# Patient Record
Sex: Female | Born: 1950 | Race: White | Hispanic: No | Marital: Married | Smoking: Never smoker
Health system: Southern US, Community
[De-identification: ages and names within clinical notes are randomized; demographics above are authoritative.]

## PROBLEM LIST (undated history)

## (undated) DIAGNOSIS — T7840XA Allergy, unspecified, initial encounter: Secondary | ICD-10-CM

## (undated) DIAGNOSIS — E7800 Pure hypercholesterolemia, unspecified: Secondary | ICD-10-CM

## (undated) DIAGNOSIS — I10 Essential (primary) hypertension: Secondary | ICD-10-CM

## (undated) DIAGNOSIS — K219 Gastro-esophageal reflux disease without esophagitis: Secondary | ICD-10-CM

## (undated) DIAGNOSIS — C801 Malignant (primary) neoplasm, unspecified: Secondary | ICD-10-CM

## (undated) DIAGNOSIS — H269 Unspecified cataract: Secondary | ICD-10-CM

## (undated) DIAGNOSIS — J45909 Unspecified asthma, uncomplicated: Secondary | ICD-10-CM

## (undated) DIAGNOSIS — M1990 Unspecified osteoarthritis, unspecified site: Secondary | ICD-10-CM

## (undated) HISTORY — PX: BREAST EXCISIONAL BIOPSY: SUR124

## (undated) HISTORY — PX: BREAST SURGERY: SHX581

## (undated) HISTORY — DX: Allergy, unspecified, initial encounter: T78.40XA

## (undated) HISTORY — DX: Unspecified cataract: H26.9

## (undated) HISTORY — DX: Malignant (primary) neoplasm, unspecified: C80.1

## (undated) HISTORY — PX: EYE SURGERY: SHX253

## (undated) HISTORY — DX: Gastro-esophageal reflux disease without esophagitis: K21.9

## (undated) HISTORY — PX: APPENDECTOMY: SHX54

## (undated) HISTORY — DX: Unspecified osteoarthritis, unspecified site: M19.90

---

## 1999-04-21 ENCOUNTER — Ambulatory Visit (HOSPITAL_COMMUNITY): Admission: RE | Admit: 1999-04-21 | Discharge: 1999-04-21 | Payer: Self-pay | Admitting: Urology

## 1999-04-21 ENCOUNTER — Encounter: Payer: Self-pay | Admitting: Urology

## 1999-05-02 ENCOUNTER — Ambulatory Visit (HOSPITAL_COMMUNITY): Admission: RE | Admit: 1999-05-02 | Discharge: 1999-05-02 | Payer: Self-pay | Admitting: Urology

## 2000-01-01 ENCOUNTER — Other Ambulatory Visit: Admission: RE | Admit: 2000-01-01 | Discharge: 2000-01-01 | Payer: Self-pay | Admitting: *Deleted

## 2000-08-14 ENCOUNTER — Encounter: Admission: RE | Admit: 2000-08-14 | Discharge: 2000-08-14 | Payer: Self-pay | Admitting: *Deleted

## 2000-08-14 ENCOUNTER — Encounter: Payer: Self-pay | Admitting: *Deleted

## 2001-02-03 ENCOUNTER — Other Ambulatory Visit: Admission: RE | Admit: 2001-02-03 | Discharge: 2001-02-03 | Payer: Self-pay | Admitting: Obstetrics and Gynecology

## 2002-02-12 ENCOUNTER — Other Ambulatory Visit: Admission: RE | Admit: 2002-02-12 | Discharge: 2002-02-12 | Payer: Self-pay | Admitting: Obstetrics and Gynecology

## 2003-02-15 ENCOUNTER — Other Ambulatory Visit: Admission: RE | Admit: 2003-02-15 | Discharge: 2003-02-15 | Payer: Self-pay | Admitting: Obstetrics and Gynecology

## 2003-04-26 ENCOUNTER — Encounter: Payer: Self-pay | Admitting: Obstetrics and Gynecology

## 2003-04-26 ENCOUNTER — Encounter: Admission: RE | Admit: 2003-04-26 | Discharge: 2003-04-26 | Payer: Self-pay | Admitting: Obstetrics and Gynecology

## 2004-03-01 ENCOUNTER — Other Ambulatory Visit: Admission: RE | Admit: 2004-03-01 | Discharge: 2004-03-01 | Payer: Self-pay | Admitting: Obstetrics and Gynecology

## 2004-04-26 ENCOUNTER — Encounter: Admission: RE | Admit: 2004-04-26 | Discharge: 2004-04-26 | Payer: Self-pay | Admitting: Obstetrics and Gynecology

## 2005-03-02 ENCOUNTER — Other Ambulatory Visit: Admission: RE | Admit: 2005-03-02 | Discharge: 2005-03-02 | Payer: Self-pay | Admitting: Obstetrics and Gynecology

## 2005-04-30 ENCOUNTER — Encounter: Admission: RE | Admit: 2005-04-30 | Discharge: 2005-04-30 | Payer: Self-pay | Admitting: Obstetrics and Gynecology

## 2005-12-04 ENCOUNTER — Emergency Department (HOSPITAL_COMMUNITY): Admission: EM | Admit: 2005-12-04 | Discharge: 2005-12-04 | Payer: Self-pay | Admitting: Emergency Medicine

## 2006-02-26 ENCOUNTER — Other Ambulatory Visit: Admission: RE | Admit: 2006-02-26 | Discharge: 2006-02-26 | Payer: Self-pay | Admitting: Obstetrics and Gynecology

## 2006-05-02 ENCOUNTER — Encounter: Admission: RE | Admit: 2006-05-02 | Discharge: 2006-05-02 | Payer: Self-pay | Admitting: Obstetrics and Gynecology

## 2006-11-20 ENCOUNTER — Encounter: Admission: RE | Admit: 2006-11-20 | Discharge: 2006-11-20 | Payer: Self-pay | Admitting: *Deleted

## 2007-01-20 ENCOUNTER — Ambulatory Visit (HOSPITAL_COMMUNITY): Admission: RE | Admit: 2007-01-20 | Discharge: 2007-01-20 | Payer: Self-pay | Admitting: *Deleted

## 2007-05-12 ENCOUNTER — Encounter: Admission: RE | Admit: 2007-05-12 | Discharge: 2007-05-12 | Payer: Self-pay | Admitting: Obstetrics and Gynecology

## 2007-05-20 ENCOUNTER — Encounter: Admission: RE | Admit: 2007-05-20 | Discharge: 2007-05-20 | Payer: Self-pay | Admitting: Obstetrics and Gynecology

## 2007-05-20 ENCOUNTER — Encounter (INDEPENDENT_AMBULATORY_CARE_PROVIDER_SITE_OTHER): Payer: Self-pay | Admitting: Diagnostic Radiology

## 2007-07-08 ENCOUNTER — Ambulatory Visit (HOSPITAL_BASED_OUTPATIENT_CLINIC_OR_DEPARTMENT_OTHER): Admission: RE | Admit: 2007-07-08 | Discharge: 2007-07-08 | Payer: Self-pay | Admitting: Surgery

## 2007-07-08 ENCOUNTER — Encounter (INDEPENDENT_AMBULATORY_CARE_PROVIDER_SITE_OTHER): Payer: Self-pay | Admitting: Surgery

## 2007-07-08 ENCOUNTER — Encounter: Admission: RE | Admit: 2007-07-08 | Discharge: 2007-07-08 | Payer: Self-pay | Admitting: Surgery

## 2008-06-15 ENCOUNTER — Encounter: Admission: RE | Admit: 2008-06-15 | Discharge: 2008-06-15 | Payer: Self-pay | Admitting: Obstetrics and Gynecology

## 2008-08-27 IMAGING — MG MM BREAST WIRE LOCALIZATION*L*
5 series · 5 of 5 positions shown · non-contrast
Comparison: 54-year-old underwent left breast ultrasound-guided core biopsy of a 7 mm mass 5 
o'clock location.

MAMMO BREAST SURGICAL SPECIMEN

BREAST WIRE LOCALIZATION *L*
BREAST WIRE LOCALIZATION, RIGHT AND SURGICAL SPECIMEN:

[L FB (1 of 2)]
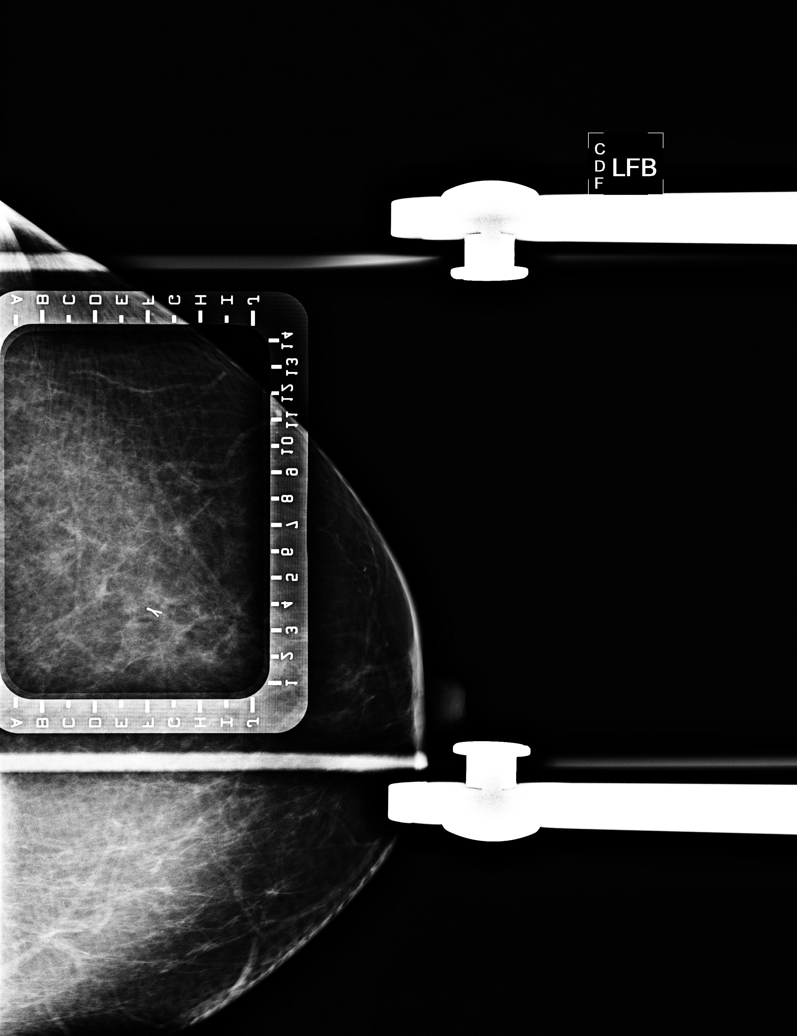

[L FB (2 of 2)]
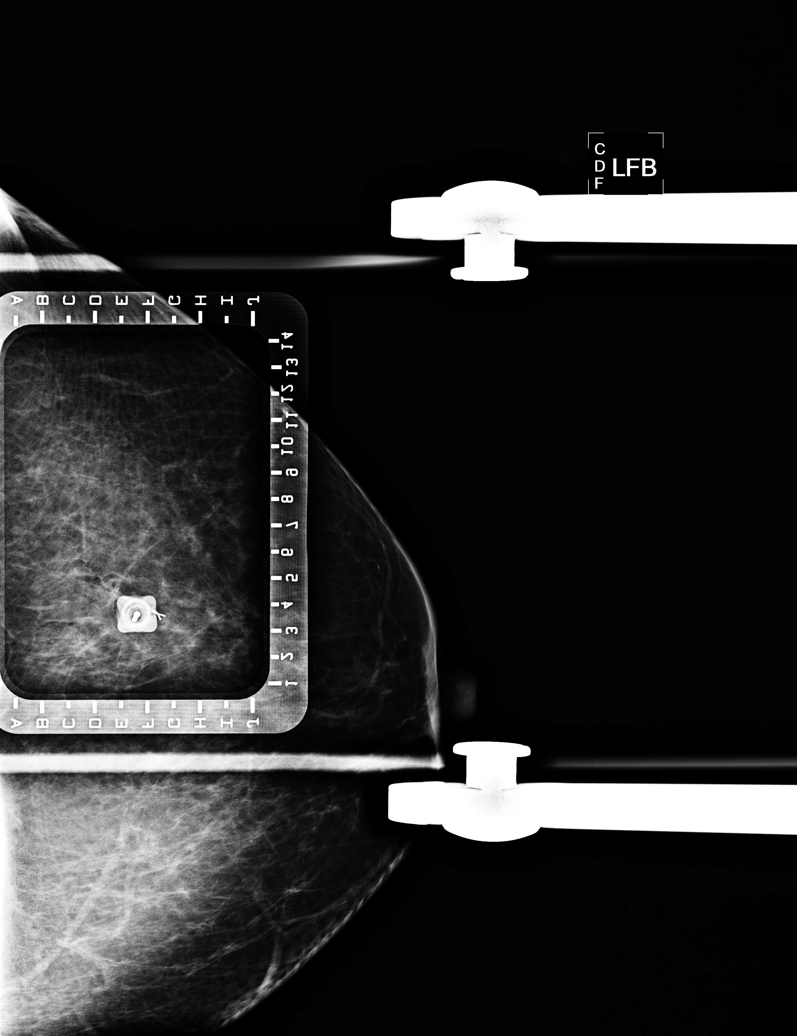

[L LM (1 of 3)]
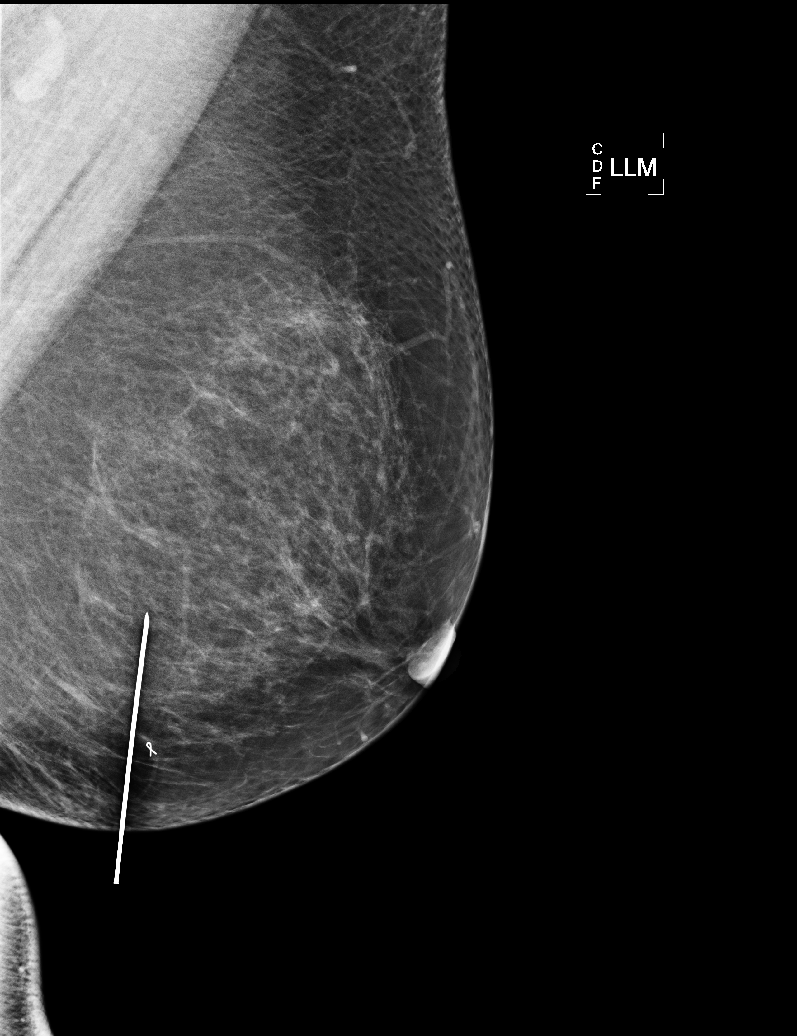

[L LM (2 of 3)]
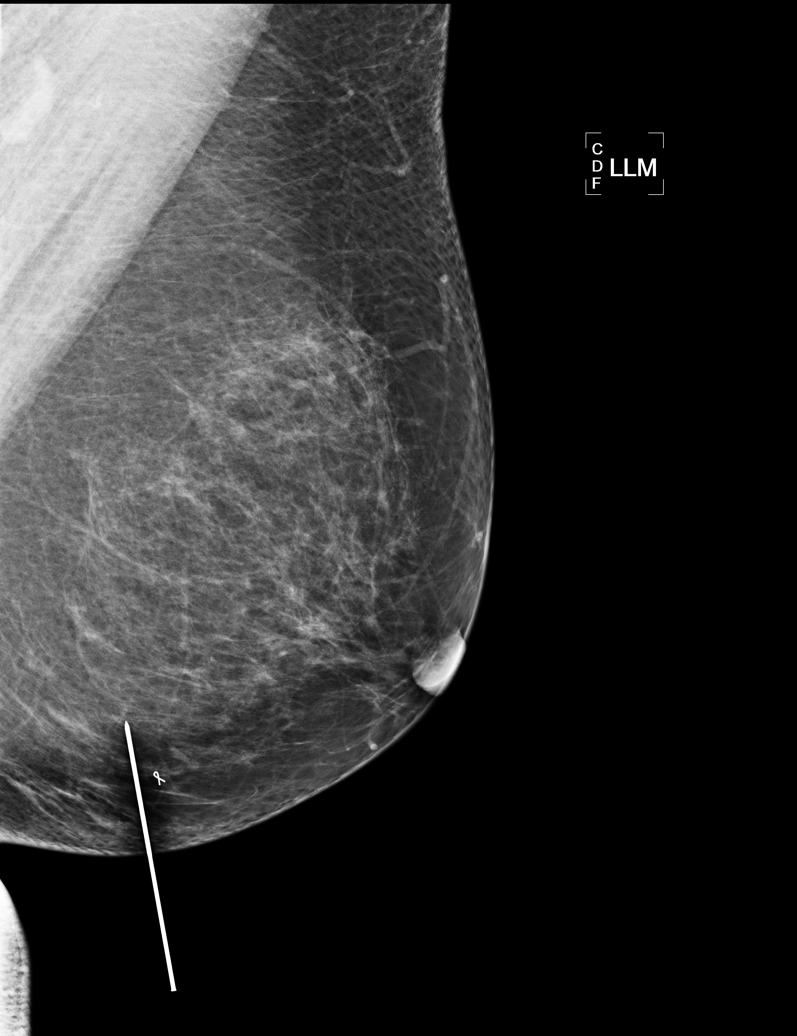

[L LM (3 of 3)]
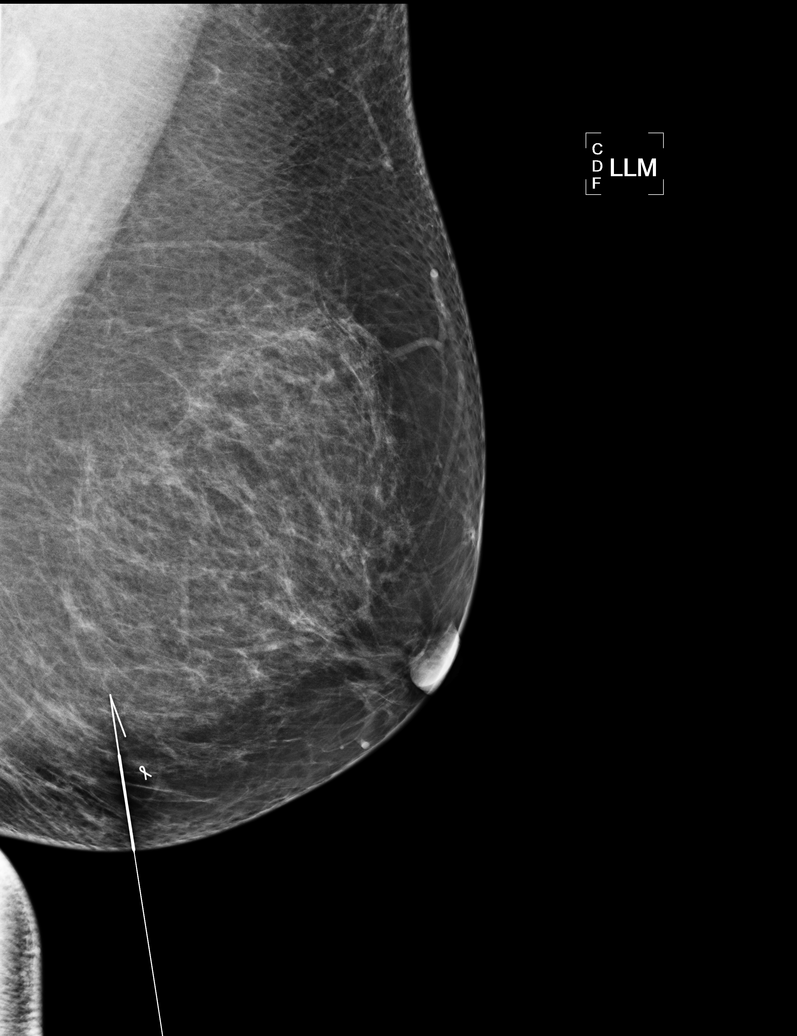

[5 of 5 positions shown; findings below may reference images not displayed]

Pathologic diagnosis reveals tubular adenoma.  Sclerosing papilloma was also in 
the differential consideration and the pathologist suggested surgical excision for complete 
evaluation.  The patient presents today for wire localization prior to surgical excision.

A  biopsy clip was placed during the time of previous ultrasound-guided biopsy and confirmed to be 
in adequate position on post biopsy mammogram.

With the left breast in the craniocaudal position in the mammogram unit, the biopsy clip was 
localized and following local anesthesia with Lidocaine, a 5 cm localization needle was advanced 
into the breast from an inferior approach.  An orthogonal view was obtained to confirm the depth of
the needle and subsequently a wire was placed through the needle.  Final mammographic image in the
lateral to medial projection confirms wire placement immediately adjacent and slightly posterior 
to the titanium biopsy clip and small residual nodule.

The patient tolerated the procedure well.

Surgical specimen was radiographed and shows the needle localization wire and the biopsy clip to be
within the surgical specimen.
IMPRESSION: Successful wire localization, surgical excisional biopsy of the left breast with pathology pending.

,

## 2009-06-16 ENCOUNTER — Encounter: Admission: RE | Admit: 2009-06-16 | Discharge: 2009-06-16 | Payer: Self-pay | Admitting: Family Medicine

## 2011-02-01 ENCOUNTER — Other Ambulatory Visit: Payer: Self-pay | Admitting: Dermatology

## 2011-03-27 NOTE — Op Note (Signed)
NAMESUZETTE, FLAGLER            ACCOUNT NO.:  [id number]   MEDICAL RECORD NO.:  [id number]          PATIENT TYPE:  AMB   LOCATION:  [facility]                          FACILITY:  [facility]   PHYSICIAN:  Wilmon Arms. Corliss Skains, M.D. DATE OF BIRTH:  Nov 12, 1951   DATE OF PROCEDURE:  07/08/2007  DATE OF DISCHARGE:                               OPERATIVE REPORT   PREOP DIAGNOSIS:  Left breast tubular adenoma.   POSTOPERATIVE DIAGNOSIS:  Left breast tubular adenoma.   PROCEDURE PERFORMED:  Left breast needle-localized lumpectomy.   SURGEON:  Wilmon Arms. Corliss Skains, M.D., FACS   ANESTHESIA:  General LMA.   INDICATIONS:  The patient is a 60 year old female who recently underwent  a screening mammogram showing a suspicious area in the left breast.  Ultrasound confirms a shadowing mass in this area.  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  The patient is now referred for complete  excision.   DESCRIPTION OF PROCEDURE:  The patient was brought to the operating room  and placed in supine position on operating table.  After an adequate  level of general anesthesia was obtained, her left breast was prepped  with Betadine and draped in a sterile fashion.  A wire had previously  been placed in the inferior left breast just above the inframammary  crease.  This area was infiltrated with 1/4% Marcaine.   A transverse incision was made around the wire.  Cautery was then used  to raise skin flaps and we excised a core of breast tissue around the  wire.  The clip was only about 3 cm deep.  The tip of the wire was noted  to be 3.5 cm deep.  We saw all the tip of the wire in the superior  margin of the specimen.  We went past the tip of the wire by another  couple of centimeters and removed the specimen.   The Faxitron x-ray machine was used to identify the clip within the  midportion of the specimen.  The tip of the wire was intact.  These  images  were transmitted to the radiologist who confirmed these findings.  The wound was irrigated.  It was closed with a deep layer of 3-0 Vicryl  and subcuticular layer of 4-0 Monocryl.  Steri-Strips and clean  dressings were applied.  The patient was extubated and brought to  recovery room in stable condition.  All sponge, instrument, and needle  counts correct.      Wilmon Arms. Tsuei, M.D.  Electronically Signed     MKT/MEDQ  D:  07/08/2007  T:  07/08/2007  Job:  [id number]

## 2011-07-18 ENCOUNTER — Other Ambulatory Visit: Payer: Self-pay | Admitting: Internal Medicine

## 2011-07-18 DIAGNOSIS — Z1231 Encounter for screening mammogram for malignant neoplasm of breast: Secondary | ICD-10-CM

## 2011-07-18 DIAGNOSIS — Z780 Asymptomatic menopausal state: Secondary | ICD-10-CM

## 2011-08-24 LAB — DIFFERENTIAL
Basophils Absolute: 0
Basophils Relative: 0
Eosinophils Absolute: 0.1
Eosinophils Relative: 2
Lymphocytes Relative: 31
Lymphs Abs: 2.1
Monocytes Absolute: 0.7
Monocytes Relative: 11
Neutro Abs: 3.8
Neutrophils Relative %: 56

## 2011-08-24 LAB — BASIC METABOLIC PANEL
BUN: 10
CO2: 28
Calcium: 9.5
Chloride: 105
Creatinine, Ser: 0.88
GFR calc Af Amer: >60
GFR calc non Af Amer: >60
Glucose, Bld: 102 — ABNORMAL HIGH
Potassium: 4.1
Sodium: 141

## 2011-08-24 LAB — CBC
HCT: 38.4
Hemoglobin: 13.2
MCHC: 34.5
MCV: 88.7
Platelets: 292
RBC: 4.33
RDW: 13.3
WBC: 6.7

## 2011-10-19 ENCOUNTER — Other Ambulatory Visit: Payer: Self-pay | Admitting: Dermatology

## 2012-03-05 ENCOUNTER — Other Ambulatory Visit: Payer: Self-pay | Admitting: Dermatology

## 2012-04-09 ENCOUNTER — Other Ambulatory Visit: Payer: Self-pay | Admitting: Internal Medicine

## 2012-04-09 DIAGNOSIS — Z780 Asymptomatic menopausal state: Secondary | ICD-10-CM

## 2012-04-09 DIAGNOSIS — Z1231 Encounter for screening mammogram for malignant neoplasm of breast: Secondary | ICD-10-CM

## 2012-04-22 ENCOUNTER — Ambulatory Visit
Admission: RE | Admit: 2012-04-22 | Discharge: 2012-04-22 | Disposition: A | Discharge status: Home / Self Care | Payer: 59 | Source: Ambulatory Visit | Attending: Internal Medicine | Admitting: Internal Medicine

## 2012-04-22 DIAGNOSIS — Z1231 Encounter for screening mammogram for malignant neoplasm of breast: Secondary | ICD-10-CM

## 2012-10-23 ENCOUNTER — Other Ambulatory Visit: Payer: Self-pay | Admitting: Dermatology

## 2013-04-23 ENCOUNTER — Other Ambulatory Visit: Payer: Self-pay | Admitting: Dermatology

## 2013-06-03 ENCOUNTER — Other Ambulatory Visit: Payer: Self-pay

## 2013-06-03 DIAGNOSIS — Z1231 Encounter for screening mammogram for malignant neoplasm of breast: Secondary | ICD-10-CM

## 2013-06-22 ENCOUNTER — Ambulatory Visit
Admission: RE | Admit: 2013-06-22 | Discharge: 2013-06-22 | Disposition: A | Discharge status: Home / Self Care | Payer: 59 | Source: Ambulatory Visit

## 2013-06-22 DIAGNOSIS — Z1231 Encounter for screening mammogram for malignant neoplasm of breast: Secondary | ICD-10-CM

## 2013-09-23 ENCOUNTER — Ambulatory Visit
Admission: RE | Admit: 2013-09-23 | Discharge: 2013-09-23 | Disposition: A | Discharge status: Home / Self Care | Payer: 59 | Source: Ambulatory Visit | Attending: Nurse Practitioner | Admitting: Nurse Practitioner

## 2013-09-23 ENCOUNTER — Other Ambulatory Visit: Payer: Self-pay | Admitting: Nurse Practitioner

## 2013-09-23 DIAGNOSIS — IMO0002 Reserved for concepts with insufficient information to code with codable children: Secondary | ICD-10-CM

## 2013-09-23 DIAGNOSIS — R109 Unspecified abdominal pain: Secondary | ICD-10-CM

## 2013-09-23 DIAGNOSIS — N898 Other specified noninflammatory disorders of vagina: Secondary | ICD-10-CM

## 2013-09-24 ENCOUNTER — Other Ambulatory Visit (HOSPITAL_COMMUNITY)
Admission: RE | Admit: 2013-09-24 | Discharge: 2013-09-24 | Disposition: A | Discharge status: Home / Self Care | Payer: 59 | Source: Ambulatory Visit | Attending: Obstetrics & Gynecology | Admitting: Obstetrics & Gynecology

## 2013-09-24 ENCOUNTER — Other Ambulatory Visit: Payer: Self-pay | Admitting: Obstetrics & Gynecology

## 2013-09-24 DIAGNOSIS — Z1151 Encounter for screening for human papillomavirus (HPV): Secondary | ICD-10-CM | POA: Insufficient documentation

## 2013-09-24 DIAGNOSIS — Z01419 Encounter for gynecological examination (general) (routine) without abnormal findings: Secondary | ICD-10-CM | POA: Insufficient documentation

## 2014-06-08 ENCOUNTER — Other Ambulatory Visit: Payer: Self-pay

## 2014-06-08 DIAGNOSIS — Z1231 Encounter for screening mammogram for malignant neoplasm of breast: Secondary | ICD-10-CM

## 2014-06-24 ENCOUNTER — Encounter (INDEPENDENT_AMBULATORY_CARE_PROVIDER_SITE_OTHER): Payer: Self-pay

## 2014-06-24 ENCOUNTER — Ambulatory Visit
Admission: RE | Admit: 2014-06-24 | Discharge: 2014-06-24 | Disposition: A | Discharge status: Home / Self Care | Payer: 59 | Source: Ambulatory Visit

## 2014-06-24 DIAGNOSIS — Z1231 Encounter for screening mammogram for malignant neoplasm of breast: Secondary | ICD-10-CM

## 2015-02-26 ENCOUNTER — Emergency Department (INDEPENDENT_AMBULATORY_CARE_PROVIDER_SITE_OTHER)
Admission: EM | Admit: 2015-02-26 | Discharge: 2015-02-26 | Disposition: A | Discharge status: Home / Self Care | Payer: 59 | Source: Home / Self Care | Attending: Family Medicine | Admitting: Family Medicine

## 2015-02-26 ENCOUNTER — Encounter (HOSPITAL_COMMUNITY): Payer: Self-pay | Admitting: *Deleted

## 2015-02-26 DIAGNOSIS — W57XXXA Bitten or stung by nonvenomous insect and other nonvenomous arthropods, initial encounter: Secondary | ICD-10-CM

## 2015-02-26 DIAGNOSIS — S70261A Insect bite (nonvenomous), right hip, initial encounter: Secondary | ICD-10-CM | POA: Diagnosis not present

## 2015-02-26 HISTORY — DX: Essential (primary) hypertension: I10

## 2015-02-26 HISTORY — DX: Pure hypercholesterolemia, unspecified: E78.00

## 2015-02-26 HISTORY — DX: Unspecified asthma, uncomplicated: J45.909

## 2015-02-26 NOTE — ED Notes (Signed)
Pt  Noticed       Tick  Today   And   Thinks     After her  Husband   Pulled  It off  But  She    Is  Concerned       That  A  Small part  Of  It  May  Be  Retained

## 2015-02-26 NOTE — ED Provider Notes (Addendum)
CSN: [id number]     Arrival date & time 02/26/15  1058 History   First MD Initiated Contact with Patient 02/26/15 1108     Chief Complaint  Patient presents with  . Tick Removal   (Consider location/radiation/quality/duration/timing/severity/associated sxs/prior Treatment) Patient is a 64 y.o. female presenting with rash. The history is provided by the patient.  Rash Location:  Leg Leg rash location:  R hip Quality comment:  Concerned that head may be retianed after body of tick removed by husband this am.no other issues. Severity:  Mild Progression:  Unchanged Chronicity:  New Relieved by:  None tried   Past Medical History  Diagnosis Date  . Hypertension   . Hypercholesterolemia   . Asthma    Past Surgical History  Procedure Laterality Date  . Appendectomy    . Eye surgery    . Breast surgery    . Cesarean section     History reviewed. No pertinent family history. History  Substance Use Topics  . Smoking status: Never Smoker   . Smokeless tobacco: Not on file  . Alcohol Use: No   OB History    No data available     Review of Systems  Constitutional: Negative.   Skin: Positive for rash.  All other systems reviewed and are negative.   Allergies  Codeine and Niacin and related  Home Medications   Prior to Admission medications   Not on File   BP 142/67 mmHg  Pulse 76  Temp(Src) 98 F (36.7 C) (Oral)  Resp 16  SpO2 100% Physical Exam  Constitutional: She appears well-nourished.  Skin: Skin is warm and dry. Rash noted.  Tiny black spec noted at site, scraped and removed with 15 blade, etoh cleansed. Dsd, possible mouth parts.  Nursing note and vitals reviewed.   ED Course  Procedures (including critical care time) Labs Review Labs Reviewed - No data to display  Imaging Review No results found.   MDM   1. Insect bite of hip, right, initial encounter        Linna HoffJames D Dagmar Adcox, MD 02/26/15 1134  Linna HoffJames D Favian Kittleson, MD 02/26/15 [phone]

## 2015-02-26 NOTE — Discharge Instructions (Signed)
Wash as needed, return if any problems °

## 2015-03-11 ENCOUNTER — Other Ambulatory Visit: Payer: Self-pay | Admitting: Dermatology

## 2015-05-23 ENCOUNTER — Other Ambulatory Visit: Payer: Self-pay

## 2015-05-23 DIAGNOSIS — Z1231 Encounter for screening mammogram for malignant neoplasm of breast: Secondary | ICD-10-CM

## 2015-06-27 ENCOUNTER — Ambulatory Visit
Admission: RE | Admit: 2015-06-27 | Discharge: 2015-06-27 | Disposition: A | Discharge status: Home / Self Care | Payer: 59 | Source: Ambulatory Visit

## 2015-06-27 DIAGNOSIS — Z1231 Encounter for screening mammogram for malignant neoplasm of breast: Secondary | ICD-10-CM

## 2015-06-29 ENCOUNTER — Other Ambulatory Visit: Payer: Self-pay | Admitting: Internal Medicine

## 2015-06-29 DIAGNOSIS — R928 Other abnormal and inconclusive findings on diagnostic imaging of breast: Secondary | ICD-10-CM

## 2015-07-05 ENCOUNTER — Ambulatory Visit
Admission: RE | Admit: 2015-07-05 | Discharge: 2015-07-05 | Disposition: A | Discharge status: Home / Self Care | Payer: 59 | Source: Ambulatory Visit | Attending: Internal Medicine | Admitting: Internal Medicine

## 2015-07-05 DIAGNOSIS — R928 Other abnormal and inconclusive findings on diagnostic imaging of breast: Secondary | ICD-10-CM

## 2015-11-02 ENCOUNTER — Other Ambulatory Visit: Payer: Self-pay | Admitting: Internal Medicine

## 2015-11-02 DIAGNOSIS — R17 Unspecified jaundice: Secondary | ICD-10-CM

## 2015-11-11 ENCOUNTER — Ambulatory Visit
Admission: RE | Admit: 2015-11-11 | Discharge: 2015-11-11 | Disposition: A | Discharge status: Home / Self Care | Payer: 59 | Source: Ambulatory Visit | Attending: Internal Medicine | Admitting: Internal Medicine

## 2015-11-11 DIAGNOSIS — R17 Unspecified jaundice: Secondary | ICD-10-CM

## 2016-06-25 ENCOUNTER — Other Ambulatory Visit: Payer: Self-pay | Admitting: Obstetrics & Gynecology

## 2016-06-25 DIAGNOSIS — Z1231 Encounter for screening mammogram for malignant neoplasm of breast: Secondary | ICD-10-CM

## 2016-07-09 ENCOUNTER — Ambulatory Visit
Admission: RE | Admit: 2016-07-09 | Discharge: 2016-07-09 | Disposition: A | Discharge status: Home / Self Care | Payer: 59 | Source: Ambulatory Visit | Attending: Obstetrics & Gynecology | Admitting: Obstetrics & Gynecology

## 2016-07-09 DIAGNOSIS — Z1231 Encounter for screening mammogram for malignant neoplasm of breast: Secondary | ICD-10-CM

## 2016-09-28 ENCOUNTER — Other Ambulatory Visit: Payer: Self-pay | Admitting: Obstetrics & Gynecology

## 2016-09-28 ENCOUNTER — Other Ambulatory Visit (HOSPITAL_COMMUNITY)
Admission: RE | Admit: 2016-09-28 | Discharge: 2016-09-28 | Disposition: A | Discharge status: Home / Self Care | Payer: 59 | Source: Ambulatory Visit | Attending: Obstetrics & Gynecology | Admitting: Obstetrics & Gynecology

## 2016-09-28 DIAGNOSIS — Z1151 Encounter for screening for human papillomavirus (HPV): Secondary | ICD-10-CM | POA: Diagnosis present

## 2016-09-28 DIAGNOSIS — Z01419 Encounter for gynecological examination (general) (routine) without abnormal findings: Secondary | ICD-10-CM | POA: Diagnosis present

## 2016-10-02 LAB — CYTOLOGY - PAP
Diagnosis: NEGATIVE
HPV: NOT DETECTED

## 2017-07-08 ENCOUNTER — Other Ambulatory Visit: Payer: Self-pay | Admitting: Obstetrics & Gynecology

## 2017-07-08 DIAGNOSIS — Z1231 Encounter for screening mammogram for malignant neoplasm of breast: Secondary | ICD-10-CM

## 2017-07-16 ENCOUNTER — Ambulatory Visit
Admission: RE | Admit: 2017-07-16 | Discharge: 2017-07-16 | Disposition: A | Discharge status: Home / Self Care | Payer: 59 | Source: Ambulatory Visit | Attending: Obstetrics & Gynecology | Admitting: Obstetrics & Gynecology

## 2017-07-16 DIAGNOSIS — Z1231 Encounter for screening mammogram for malignant neoplasm of breast: Secondary | ICD-10-CM

## 2018-07-01 ENCOUNTER — Other Ambulatory Visit: Payer: Self-pay | Admitting: Obstetrics & Gynecology

## 2018-07-01 DIAGNOSIS — Z1231 Encounter for screening mammogram for malignant neoplasm of breast: Secondary | ICD-10-CM

## 2018-07-23 ENCOUNTER — Ambulatory Visit: Payer: 59

## 2018-07-23 ENCOUNTER — Ambulatory Visit
Admission: RE | Admit: 2018-07-23 | Discharge: 2018-07-23 | Disposition: A | Discharge status: Home / Self Care | Payer: Medicare Other | Source: Ambulatory Visit | Attending: Obstetrics & Gynecology | Admitting: Obstetrics & Gynecology

## 2018-07-23 DIAGNOSIS — Z1231 Encounter for screening mammogram for malignant neoplasm of breast: Secondary | ICD-10-CM

## 2018-12-05 ENCOUNTER — Encounter: Payer: Self-pay | Admitting: Family Medicine

## 2018-12-05 ENCOUNTER — Ambulatory Visit (INDEPENDENT_AMBULATORY_CARE_PROVIDER_SITE_OTHER): Payer: Medicare Other | Admitting: Family Medicine

## 2018-12-05 VITALS — BP 128/74 | HR 78 | Temp 98.1°F | Ht 65.0 in | Wt 182.0 lb

## 2018-12-05 DIAGNOSIS — Z7689 Persons encountering health services in other specified circumstances: Secondary | ICD-10-CM | POA: Diagnosis not present

## 2018-12-05 DIAGNOSIS — E785 Hyperlipidemia, unspecified: Secondary | ICD-10-CM

## 2018-12-05 DIAGNOSIS — I10 Essential (primary) hypertension: Secondary | ICD-10-CM

## 2018-12-05 DIAGNOSIS — J4540 Moderate persistent asthma, uncomplicated: Secondary | ICD-10-CM | POA: Diagnosis not present

## 2018-12-05 NOTE — Patient Instructions (Signed)
Asthma, Adult  Asthma is a long-term (chronic) condition that causes recurrent episodes in which the airways become tight and narrow. The airways are the passages that lead from the nose and mouth down into the lungs. Asthma episodes, also called asthma attacks, can cause coughing, wheezing, shortness of breath, and chest pain. The airways can also fill with mucus. During an attack, it can be difficult to breathe. Asthma attacks can range from minor to life threatening. Asthma cannot be cured, but medicines and lifestyle changes can help control it and treat acute attacks. What are the causes? This condition is believed to be caused by inherited (genetic) and environmental factors, but its exact cause is not known. There are many things that can bring on an asthma attack or make asthma symptoms worse (triggers). Asthma triggers are different for each person. Common triggers include:  Mold.  Dust.  Cigarette smoke.  Cockroaches.  Things that can cause allergy symptoms (allergens), such as animal dander or pollen from trees or grass.  Air pollutants such as household cleaners, wood smoke, smog, or chemical odors.  Cold air, weather changes, and winds (which increase molds and pollen in the air).  Strong emotional expressions such as crying or laughing hard.  Stress.  Certain medicines (such as aspirin) or types of medicines (such as beta-blockers).  Sulfites in foods and drinks. Foods and drinks that may contain sulfites include dried fruit, potato chips, and sparkling grape juice.  Infections or inflammatory conditions such as the flu, a cold, or inflammation of the nasal membranes (rhinitis).  Gastroesophageal reflux disease (GERD).  Exercise or strenuous activity. What are the signs or symptoms? Symptoms of this condition may occur right after asthma is triggered or many hours later. Symptoms include:  Wheezing. This can sound like whistling when you breathe.  Excessive  nighttime or early morning coughing.  Frequent or severe coughing with a common cold.  Chest tightness.  Shortness of breath.  Tiredness (fatigue) with minimal activity. How is this diagnosed? This condition is diagnosed based on:  Your medical history.  A physical exam.  Tests, which may include: ? Lung function studies and pulmonary studies (spirometry). These tests can evaluate the flow of air in your lungs. ? Allergy tests. ? Imaging tests, such as X-rays. How is this treated? There is no cure for this condition, but treatment can help control your symptoms. Treatment for asthma usually involves:  Identifying and avoiding your asthma triggers.  Using medicines to control your symptoms. Generally, two types of medicines are used to treat asthma: ? Controller medicines. These help prevent asthma symptoms from occurring. They are usually taken every day. ? Fast-acting reliever or rescue medicines. These quickly relieve asthma symptoms by widening the narrow and tight airways. They are used as needed and provide short-term relief.  Using supplemental oxygen. This may be needed during a severe episode.  Using other medicines, such as: ? Allergy medicines, such as antihistamines, if your asthma attacks are triggered by allergens. ? Immune medicines (immunomodulators). These are medicines that help control the immune system.  Creating an asthma action plan. An asthma action plan is a written plan for managing and treating your asthma attacks. This plan includes: ? A list of your asthma triggers and how to avoid them. ? Information about when medicines should be taken and when their dosage should be changed. ? Instructions about using a device called a peak flow meter. A peak flow meter measures how well the lungs are working and the   severity of your asthma. It helps you monitor your condition. Follow these instructions at home: Controlling your home environment Control your home  environment in the following ways to help avoid triggers and prevent asthma attacks:  Change your heating and air conditioning filter regularly.  Limit your use of fireplaces and wood stoves.  Get rid of pests (such as roaches and mice) and their droppings.  Throw away plants if you see mold on them.  Clean floors and dust surfaces regularly. Use unscented cleaning products.  Try to have someone else vacuum for you regularly. Stay out of rooms while they are being vacuumed and for a short while afterward. If you vacuum, use a dust mask from a hardware store, a double-layered or microfilter vacuum cleaner bag, or a vacuum cleaner with a HEPA filter.  Replace carpet with wood, tile, or vinyl flooring. Carpet can trap dander and dust.  Use allergy-proof pillows, mattress covers, and box spring covers.  Keep your bedroom a trigger-free room.  Avoid pets and keep windows closed when allergens are in the air.  Wash beddings every week in hot water and dry them in a dryer.  Use blankets that are made of polyester or cotton.  Clean bathrooms and kitchens with bleach. If possible, have someone repaint the walls in these rooms with mold-resistant paint. Stay out of the rooms that are being cleaned and painted.  Wash your hands often with soap and water. If soap and water are not available, use hand sanitizer.  Do not allow anyone to smoke in your home. General instructions  Take over-the-counter and prescription medicines only as told by your health care provider. ? Speak with your health care provider if you have questions about how or when to take the medicines. ? Make note if you are requiring more frequent dosages.  Do not use any products that contain nicotine or tobacco, such as cigarettes and e-cigarettes. If you need help quitting, ask your health care provider. Also, avoid being exposed to secondhand smoke.  Use a peak flow meter as told by your health care provider. Record and  keep track of the readings.  Understand and use the asthma action plan to help minimize, or stop an asthma attack, without needing to seek medical care.  Make sure you stay up to date on your yearly vaccinations as told by your health care provider. This may include vaccines for the flu and pneumonia.  Avoid outdoor activities when allergen counts are high and when air quality is low.  Wear a ski mask that covers your nose and mouth during outdoor winter activities. Exercise indoors on cold days if you can.  Warm up before exercising, and take time for a cool-down period after exercise.  Keep all follow-up visits as told by your health care provider. This is important. Where to find more information  For information about asthma, turn to the Centers for Disease Control and Prevention at www.cdc.gov/asthma/faqs.htm  For air quality information, turn to AirNow at https://airnow.gov/ Contact a health care provider if:  You have wheezing, shortness of breath, or a cough even while you are taking medicine to prevent attacks.  The mucus you cough up (sputum) is thicker than usual.  Your sputum changes from clear or white to yellow, green, gray, or bloody.  Your medicines are causing side effects, such as a rash, itching, swelling, or trouble breathing.  You need to use a reliever medicine more than 2-3 times a week.  Your peak flow reading   is still at 50-79% of your personal best after following your action plan for 1 hour.  You have a fever. Get help right away if:  You are getting worse and do not respond to treatment during an asthma attack.  You are short of breath when at rest or when doing very little physical activity.  You have difficulty eating, drinking, or talking.  You have chest pain or tightness.  You develop a fast heartbeat or palpitations.  You have a bluish color to your lips or fingernails.  You are light-headed or dizzy, or you faint.  Your peak flow  reading is less than 50% of your personal best.  You feel too tired to breathe normally. Summary  Asthma is a long-term (chronic) condition that causes recurrent episodes in which the airways become tight and narrow. These episodes can cause coughing, wheezing, shortness of breath, and chest pain.  Asthma cannot be cured, but medicines and lifestyle changes can help control it and treat acute attacks.  Make sure you understand how to avoid triggers and how and when to use your medicines.  Asthma attacks can range from minor to life threatening. Get help right away if you have an asthma attack and do not respond to treatment with your usual rescue medicines. This information is not intended to replace advice given to you by your health care provider. Make sure you discuss any questions you have with your health care provider. Document Released: 10/29/2005 Document Revised: 12/03/2016 Document Reviewed: 12/03/2016 Elsevier Interactive Patient Education  2019 [employer]  Managing Your Hypertension Hypertension is commonly called high blood pressure. This is when the force of your blood pressing against the walls of your arteries is too strong. Arteries are blood vessels that carry blood from your heart throughout your body. Hypertension forces the heart to work harder to pump blood, and may cause the arteries to become narrow or stiff. Having untreated or uncontrolled hypertension can cause heart attack, stroke, kidney disease, and other problems. What are blood pressure readings? A blood pressure reading consists of a higher number over a lower number. Ideally, your blood pressure should be below 120/80. The first ("top") number is called the systolic pressure. It is a measure of the pressure in your arteries as your heart beats. The second ("bottom") number is called the diastolic pressure. It is a measure of the pressure in your arteries as the heart relaxes. What does my blood pressure  reading mean? Blood pressure is classified into four stages. Based on your blood pressure reading, your health care provider may use the following stages to determine what type of treatment you need, if any. Systolic pressure and diastolic pressure are measured in a unit called mm Hg. Normal  Systolic pressure: below 120.  Diastolic pressure: below 80. Elevated  Systolic pressure: 120-129.  Diastolic pressure: below 80. Hypertension stage 1  Systolic pressure: 130-139.  Diastolic pressure: 80-89. Hypertension stage 2  Systolic pressure: 140 or above.  Diastolic pressure: 90 or above. What health risks are associated with hypertension? Managing your hypertension is an important responsibility. Uncontrolled hypertension can lead to:  A heart attack.  A stroke.  A weakened blood vessel (aneurysm).  Heart failure.  Kidney damage.  Eye damage.  Metabolic syndrome.  Memory and concentration problems. What changes can I make to manage my hypertension? Hypertension can be managed by making lifestyle changes and possibly by taking medicines. Your health care provider will help you make a plan to bring your blood   pressure within a normal range. Eating and drinking   Eat a diet that is high in fiber and potassium, and low in salt (sodium), added sugar, and fat. An example eating plan is called the DASH (Dietary Approaches to Stop Hypertension) diet. To eat this way: ? Eat plenty of fresh fruits and vegetables. Try to fill half of your plate at each meal with fruits and vegetables. ? Eat whole grains, such as whole wheat pasta, brown rice, or whole grain bread. Fill about one quarter of your plate with whole grains. ? Eat low-fat diary products. ? Avoid fatty cuts of meat, processed or cured meats, and poultry with skin. Fill about one quarter of your plate with lean proteins such as fish, chicken without skin, beans, eggs, and tofu. ? Avoid premade and processed foods. These  tend to be higher in sodium, added sugar, and fat.  Reduce your daily sodium intake. Most people with hypertension should eat less than 1,500 mg of sodium a day.  Limit alcohol intake to no more than 1 drink a day for nonpregnant women and 2 drinks a day for men. One drink equals 12 oz of beer, 5 oz of wine, or 1 oz of hard liquor. Lifestyle  Work with your health care provider to maintain a healthy body weight, or to lose weight. Ask what an ideal weight is for you.  Get at least 30 minutes of exercise that causes your heart to beat faster (aerobic exercise) most days of the week. Activities may include walking, swimming, or biking.  Include exercise to strengthen your muscles (resistance exercise), such as weight lifting, as part of your weekly exercise routine. Try to do these types of exercises for 30 minutes at least 3 days a week.  Do not use any products that contain nicotine or tobacco, such as cigarettes and e-cigarettes. If you need help quitting, ask your health care provider.  Control any long-term (chronic) conditions you have, such as high cholesterol or diabetes. Monitoring  Monitor your blood pressure at home as told by your health care provider. Your personal target blood pressure may vary depending on your medical conditions, your age, and other factors.  Have your blood pressure checked regularly, as often as told by your health care provider. Working with your health care provider  Review all the medicines you take with your health care provider because there may be side effects or interactions.  Talk with your health care provider about your diet, exercise habits, and other lifestyle factors that may be contributing to hypertension.  Visit your health care provider regularly. Your health care provider can help you create and adjust your plan for managing hypertension. Will I need medicine to control my blood pressure? Your health care provider may prescribe medicine  if lifestyle changes are not enough to get your blood pressure under control, and if:  Your systolic blood pressure is 130 or higher.  Your diastolic blood pressure is 80 or higher. Take medicines only as told by your health care provider. Follow the directions carefully. Blood pressure medicines must be taken as prescribed. The medicine does not work as well when you skip doses. Skipping doses also puts you at risk for problems. Contact a health care provider if:  You think you are having a reaction to medicines you have taken.  You have repeated (recurrent) headaches.  You feel dizzy.  You have swelling in your ankles.  You have trouble with your vision. Get help right away if:    You develop a severe headache or confusion.  You have unusual weakness or numbness, or you feel faint.  You have severe pain in your chest or abdomen.  You vomit repeatedly.  You have trouble breathing. Summary  Hypertension is when the force of blood pumping through your arteries is too strong. If this condition is not controlled, it may put you at risk for serious complications.  Your personal target blood pressure may vary depending on your medical conditions, your age, and other factors. For most people, a normal blood pressure is less than 120/80.  Hypertension is managed by lifestyle changes, medicines, or both. Lifestyle changes include weight loss, eating a healthy, low-sodium diet, exercising more, and limiting alcohol. This information is not intended to replace advice given to you by your health care provider. Make sure you discuss any questions you have with your health care provider. Document Released: 07/23/2012 Document Revised: 09/26/2016 Document Reviewed: 09/26/2016 Elsevier Interactive Patient Education  2019 [employer]  

## 2018-12-05 NOTE — Progress Notes (Signed)
Patient presents to clinic today to follow-up on chronic conditions and establish care.  SUBJECTIVE: PMH: Pt is a 68 yo female with pmh sig for HTN, asthma.  Patient is previously seen at [facility].  Patient is also followed by OB/GYN.  HTN: -dx'd 8 years ago -Checks BP at home. -Typically 120/80 -Norvasc 5 mg daily  HLD: -Taking simvastatin 20 mg daily -Denies myalgias  Asthma: -Seen by asthma and allergy clinic Dr. [place] Callas -Currently taking Advair 250/50 daily -Also has albuterol inhaler as needed -States symptoms are pretty well controlled -Has an EpiPen for possible allergy to mold/dust.  History of melanoma: -s/p removal -Followed by dermatology -Seen q 6 months for skin checks  History of abnormal mammogram -Pt expresses frustration after having to have a breast biopsy -Pt describes the procedure is extremely uncomfortable -Pt hesitant to continue getting mammograms -States always gets a phone call due to a questionable area seen on mammogram which ends up being scar tissue from her previous procedure.  Allergies: Lisinopril half-cough Codeine-throat edema Niacin-flushing Wixela (generic Advair)-tremors and dizziness  Past surgical history: Appendectomy C-section Melanoma removal Renal calculi with J-line stent placement and subsequent removal "after becoming stuck"  Social history: Patient denies alcohol, tobacco, drug use.  Health Maintenance: Vision --seen by ophthalmology 12/11/2017 Mammogram --07/23/2018  Past Medical History:  Diagnosis Date  . Asthma   . Hypercholesterolemia   . Hypertension     Past Surgical History:  Procedure Laterality Date  . APPENDECTOMY    . BREAST EXCISIONAL BIOPSY Left   . BREAST SURGERY    . CESAREAN SECTION    . EYE SURGERY      Current Outpatient Medications on File Prior to Visit  Medication Sig Dispense Refill  . albuterol (PROVENTIL HFA;VENTOLIN HFA) 108 (90 Base) MCG/ACT inhaler Inhale 1 puff  into the lungs as needed for wheezing or shortness of breath.    Marland Kitchen amLODipine (NORVASC) 5 MG tablet Take 5 mg by mouth daily.    Marland Kitchen EPINEPHrine 0.3 mg/0.3 mL IJ SOAJ injection Inject 0.3 mg into the muscle as needed for anaphylaxis.    Marland Kitchen estradiol (ESTRACE) 0.1 MG/GM vaginal cream Place 1 Applicatorful vaginally every 7 (seven) days.    . Fluticasone-Salmeterol (ADVAIR DISKUS) 250-50 MCG/DOSE AEPB Inhale 1 puff into the lungs 2 (two) times daily.    . simvastatin (ZOCOR) 20 MG tablet Take 20 mg by mouth daily.    . zafirlukast (ACCOLATE) 20 MG tablet Take 20 mg by mouth 2 (two) times daily before a meal.     No current facility-administered medications on file prior to visit.     Allergies  Allergen Reactions  . Codeine   . Lisinopril     cough  . Niacin And Related   . Wixela Inhub [Fluticasone-Salmeterol]     Gets jittery    History reviewed. No pertinent family history.  Social History   Socioeconomic History  . Marital status: Married    Spouse name: Not on file  . Number of children: Not on file  . Years of education: Not on file  . Highest education level: Not on file  Occupational History  . Not on file  Social Needs  . Financial resource strain: Not on file  . Food insecurity:    Worry: Not on file    Inability: Not on file  . Transportation needs:    Medical: Not on file    Non-medical: Not on file  Tobacco Use  . Smoking status:  Never Smoker  . Smokeless tobacco: Never Used  Substance and Sexual Activity  . Alcohol use: No  . Drug use: Never  . Sexual activity: Not on file  Lifestyle  . Physical activity:    Days per week: Not on file    Minutes per session: Not on file  . Stress: Not on file  Relationships  . Social connections:    Talks on phone: Not on file    Gets together: Not on file    Attends religious service: Not on file    Active member of club or organization: Not on file    Attends meetings of clubs or organizations: Not on file     Relationship status: Not on file  . Intimate partner violence:    Fear of current or ex partner: Not on file    Emotionally abused: Not on file    Physically abused: Not on file    Forced sexual activity: Not on file  Other Topics Concern  . Not on file  Social History Narrative  . Not on file    ROS General: Denies fever, chills, night sweats, changes in weight, changes in appetite HEENT: Denies headaches, ear pain, changes in vision, rhinorrhea, sore throat CV: Denies CP, palpitations, SOB, orthopnea Pulm: Denies SOB, cough, wheezing GI: Denies abdominal pain, nausea, vomiting, diarrhea, constipation GU: Denies dysuria, hematuria, frequency, vaginal discharge Msk: Denies muscle cramps, joint pains Neuro: Denies weakness, numbness, tingling Skin: Denies rashes, bruising Psych: Denies depression, anxiety, hallucinations  BP 128/74 (BP Location: Left Arm, Patient Position: Sitting, Cuff Size: Normal)   Pulse 78   Temp 98.1 F (36.7 C) (Oral)   Ht 5\' 5"  (1.651 m)   Wt 182 lb (82.6 kg)   SpO2 98%   BMI 30.29 kg/m   Physical Exam Gen. Pleasant, well developed, well-nourished, in NAD HEENT - [place]/AT, PERRL, no scleral icterus, no nasal drainage, pharynx without erythema or exudate. Lungs: no use of accessory muscles, CTAB, no wheezes, rales or rhonchi Cardiovascular: RRR, No r/g/m, no peripheral edema Abdomen: BS present, soft, nontender,nondistended Neuro:  A&Ox3, CN II-XII intact, normal gait Skin:  Warm, dry, intact, no lesions  No results found for this or any previous visit (from the past 2160 hour(s)).  Assessment/Plan: Essential hypertension -Controlled -BP initially elevated at 150/90 on arrival.  Recheck done and found to be normal -Continue Norvasc 5 mg daily -Pt encouraged to continue checking BP at home and continue lifestyle modifications  Moderate persistent asthma without complication -Continue Advair 250/50 -Continue albuterol inhaler as needed -Patient  encouraged to continue follow-up with allergy and asthma, Dr. [place] Callas  HLD -Continue simvastatin 20 mg daily -Discussed lifestyle modifications -We will recheck lipid panel at next [facility] for CPE  Encounter to establish care -We reviewed the PMH, PSH, FH, SH, Meds and Allergies. -We provided refills for any medications we will prescribe as needed. -We addressed current concerns per orders and patient instructions. -We have asked for records for pertinent exams, studies, vaccines and notes from previous providers. -We have advised patient to follow up per instructions below.  Follow-up PRN in the next few months for CPE  Abbe Amsterdam, MD

## 2018-12-09 ENCOUNTER — Encounter: Payer: Self-pay | Admitting: Family Medicine

## 2018-12-09 DIAGNOSIS — J4540 Moderate persistent asthma, uncomplicated: Secondary | ICD-10-CM | POA: Insufficient documentation

## 2018-12-09 DIAGNOSIS — I10 Essential (primary) hypertension: Secondary | ICD-10-CM | POA: Insufficient documentation

## 2019-01-01 ENCOUNTER — Other Ambulatory Visit: Payer: Self-pay

## 2019-01-01 ENCOUNTER — Telehealth: Payer: Self-pay | Admitting: Family Medicine

## 2019-01-01 MED ORDER — AMLODIPINE BESYLATE 5 MG PO TABS: 5.0000 mg | ORAL_TABLET | Freq: Every day | ORAL | 1 refills | Status: DC

## 2019-01-01 NOTE — Telephone Encounter (Signed)
Rx was sent to pt pharmacy as requested °

## 2019-01-01 NOTE — Telephone Encounter (Signed)
Copied from CRM [id number]. Topic: Quick Communication - Rx Refill/Question >> Jan 01, 2019 10:23 AM Gaynelle Adu wrote: Medication: amLODipine (NORVASC) 5 MG tablet  Has the patient contacted their pharmacy? Yes   Preferred Pharmacy (with phone number or street name): [id number] [place], [place] - [address] [phone] (Phone) [phone] (Fax)    Agent: Please be advised that RX refills may take up to 3 business days. We ask that you follow-up with your pharmacy.

## 2019-06-12 ENCOUNTER — Other Ambulatory Visit: Payer: Self-pay | Admitting: Obstetrics & Gynecology

## 2019-06-12 ENCOUNTER — Telehealth: Payer: Self-pay | Admitting: Family Medicine

## 2019-06-12 DIAGNOSIS — Z1231 Encounter for screening mammogram for malignant neoplasm of breast: Secondary | ICD-10-CM

## 2019-06-12 NOTE — Telephone Encounter (Signed)
Pt called and stated that she would like bone density test. Please advise

## 2019-06-16 NOTE — Telephone Encounter (Signed)
Ok to place order for dexa scan.

## 2019-06-16 NOTE — Telephone Encounter (Signed)
Please advise 

## 2019-06-17 ENCOUNTER — Other Ambulatory Visit: Payer: Self-pay

## 2019-06-17 ENCOUNTER — Encounter: Payer: Self-pay | Admitting: Family Medicine

## 2019-06-17 ENCOUNTER — Ambulatory Visit (INDEPENDENT_AMBULATORY_CARE_PROVIDER_SITE_OTHER): Payer: Medicare Other | Admitting: Family Medicine

## 2019-06-17 ENCOUNTER — Ambulatory Visit (INDEPENDENT_AMBULATORY_CARE_PROVIDER_SITE_OTHER): Payer: Medicare Other

## 2019-06-17 ENCOUNTER — Other Ambulatory Visit: Payer: Self-pay | Admitting: *Deleted

## 2019-06-17 VITALS — BP 148/80 | HR 82 | Temp 98.6°F | Ht 67.0 in | Wt 182.6 lb

## 2019-06-17 DIAGNOSIS — M25542 Pain in joints of left hand: Secondary | ICD-10-CM

## 2019-06-17 DIAGNOSIS — I10 Essential (primary) hypertension: Secondary | ICD-10-CM

## 2019-06-17 DIAGNOSIS — Z23 Encounter for immunization: Secondary | ICD-10-CM | POA: Diagnosis not present

## 2019-06-17 DIAGNOSIS — M25541 Pain in joints of right hand: Secondary | ICD-10-CM | POA: Diagnosis not present

## 2019-06-17 DIAGNOSIS — Z0001 Encounter for general adult medical examination with abnormal findings: Secondary | ICD-10-CM | POA: Diagnosis not present

## 2019-06-17 DIAGNOSIS — Z13820 Encounter for screening for osteoporosis: Secondary | ICD-10-CM

## 2019-06-17 DIAGNOSIS — J4540 Moderate persistent asthma, uncomplicated: Secondary | ICD-10-CM

## 2019-06-17 DIAGNOSIS — Z1211 Encounter for screening for malignant neoplasm of colon: Secondary | ICD-10-CM

## 2019-06-17 DIAGNOSIS — Z0000 Encounter for general adult medical examination without abnormal findings: Secondary | ICD-10-CM

## 2019-06-17 NOTE — Patient Instructions (Addendum)
Health Maintenance Due  Topic Date Due  . Hepatitis C Screening  [birth year]  . HIV Screening  07/18/1968  . TETANUS/TDAP  07/18/1972  . COLONOSCOPY  07/19/2003  . DEXA SCAN  07/18/2018  . PNA vac Low Risk Adult (1 of 2 - PCV13) 07/18/2018  . INFLUENZA VACCINE  06/13/2019    No flowsheet data found. Preventive Care 24 Years and Older, Female Preventive care refers to lifestyle choices and visits with your health care provider that can promote health and wellness. This includes:  A yearly physical exam. This is also called an annual well check.  Regular dental and eye exams.  Immunizations.  Screening for certain conditions.  Healthy lifestyle choices, such as diet and exercise. What can I expect for my preventive care visit? Physical exam Your health care provider will check:  Height and weight. These may be used to calculate body mass index (BMI), which is a measurement that tells if you are at a healthy weight.  Heart rate and blood pressure.  Your skin for abnormal spots. Counseling Your health care provider may ask you questions about:  Alcohol, tobacco, and drug use.  Emotional well-being.  Home and relationship well-being.  Sexual activity.  Eating habits.  History of falls.  Memory and ability to understand (cognition).  Work and work Statistician.  Pregnancy and menstrual history. What immunizations do I need?  Influenza (flu) vaccine  This is recommended every year. Tetanus, diphtheria, and pertussis (Tdap) vaccine  You may need a Td booster every 10 years. Varicella (chickenpox) vaccine  You may need this vaccine if you have not already been vaccinated. Zoster (shingles) vaccine  You may need this after age 54. Pneumococcal conjugate (PCV13) vaccine  One dose is recommended after age 48. Pneumococcal polysaccharide (PPSV23) vaccine  One dose is recommended after age 27. Measles, mumps, and rubella (MMR) vaccine  You may need at least  one dose of MMR if you were born in 1957 or later. You may also need a second dose. Meningococcal conjugate (MenACWY) vaccine  You may need this if you have certain conditions. Hepatitis A vaccine  You may need this if you have certain conditions or if you travel or work in places where you may be exposed to hepatitis A. Hepatitis B vaccine  You may need this if you have certain conditions or if you travel or work in places where you may be exposed to hepatitis B. Haemophilus influenzae type b (Hib) vaccine  You may need this if you have certain conditions. You may receive vaccines as individual doses or as more than one vaccine together in one shot (combination vaccines). Talk with your health care provider about the risks and benefits of combination vaccines. What tests do I need? Blood tests  Lipid and cholesterol levels. These may be checked every 5 years, or more frequently depending on your overall health.  Hepatitis C test.  Hepatitis B test. Screening  Lung cancer screening. You may have this screening every year starting at age 44 if you have a 30-pack-year history of smoking and currently smoke or have quit within the past 15 years.  Colorectal cancer screening. All adults should have this screening starting at age 54 and continuing until age 68. Your health care provider may recommend screening at age 57 if you are at increased risk. You will have tests every 1-10 years, depending on your results and the type of screening test.  Diabetes screening. This is done by checking your blood sugar (  glucose) after you have not eaten for a while (fasting). You may have this done every 1-3 years.  Mammogram. This may be done every 1-2 years. Talk with your health care provider about how often you should have regular mammograms.  BRCA-related cancer screening. This may be done if you have a family history of breast, ovarian, tubal, or peritoneal cancers. Other tests  Sexually  transmitted disease (STD) testing.  Bone density scan. This is done to screen for osteoporosis. You may have this done starting at age 67. Follow these instructions at home: Eating and drinking  Eat a diet that includes fresh fruits and vegetables, whole grains, lean protein, and low-fat dairy products. Limit your intake of foods with high amounts of sugar, saturated fats, and salt.  Take vitamin and mineral supplements as recommended by your health care provider.  Do not drink alcohol if your health care provider tells you not to drink.  If you drink alcohol: ? Limit how much you have to 0-1 drink a day. ? Be aware of how much alcohol is in your drink. In the [place] one drink equals one 12 oz bottle of beer (355 mL), one 5 oz glass of wine (148 mL), or one 1 oz glass of hard liquor (44 mL). Lifestyle  Take daily care of your teeth and gums.  Stay active. Exercise for at least 30 minutes on 5 or more days each week.  Do not use any products that contain nicotine or tobacco, such as cigarettes, e-cigarettes, and chewing tobacco. If you need help quitting, ask your health care provider.  If you are sexually active, practice safe sex. Use a condom or other form of protection in order to prevent STIs (sexually transmitted infections).  Talk with your health care provider about taking a low-dose aspirin or statin. What's next?  Go to your health care provider once a year for a well check visit.  Ask your health care provider how often you should have your eyes and teeth checked.  Stay up to date on all vaccines. This information is not intended to replace advice given to you by your health care provider. Make sure you discuss any questions you have with your health care provider. Document Released: 11/25/2015 Document Revised: 10/23/2018 Document Reviewed: 10/23/2018 Elsevier Patient Education  2020 [place] Prevention, Adult Although you may not be able to  control the fact that you have asthma, you can take actions to prevent episodes of asthma (asthma attacks). These actions include:  Creating a written plan for managing and treating your asthma attacks (asthma action plan).  Monitoring your asthma.  Avoiding things that can irritate your airways or make your asthma symptoms worse (asthma triggers).  Taking your medicines as directed.  Acting quickly if you have signs or symptoms of an asthma attack. What are some ways to prevent an asthma attack? Create a plan Work with your health care provider to create an asthma action plan. This plan should include:  A list of your asthma triggers and how to avoid them.  A list of symptoms that you experience during an asthma attack.  Information about when to take medicine and how much medicine to take.  Information to help you understand your peak flow measurements.  Contact information for your health care providers.  Daily actions that you can take to control asthma. Monitor your asthma To monitor your asthma:  Use your peak flow meter every morning and every evening for 2-3 weeks. Record  the results in a journal. A drop in your peak flow numbers on one or more days may mean that you are starting to have an asthma attack, even if you are not having symptoms.  When you have asthma symptoms, write them down in a journal.  Avoid asthma triggers Work with your health care provider to find out what your asthma triggers are. This can be done by:  Being tested for allergies.  Keeping a journal that notes when asthma attacks occur and what may have contributed to them.  Asking your health care provider whether other medical conditions make your asthma worse. Common asthma triggers include:  Dust.  Smoke. This includes campfire smoke and secondhand smoke from tobacco products.  Pet dander.  Trees, grasses or pollens.  Very cold, dry, or humid air.  Mold.  Foods that contain high  amounts of sulfites.  Strong smells.  Engine exhaust and air pollution.  Aerosol sprays and fumes from household cleaners.  Household pests and their droppings, including dust mites and cockroaches.  Certain medicines, including NSAIDs. Once you have determined your asthma triggers, take steps to avoid them. Depending on your triggers, you may be able to reduce the chance of an asthma attack by:  Keeping your home clean. Have someone dust and vacuum your home for you 1 or 2 times a week. If possible, have them use a high-efficiency particulate arrestance (HEPA) vacuum.  Washing your sheets weekly in hot water.  Using allergy-proof mattress covers and casings on your bed.  Keeping pets out of your home.  Taking care of mold and water problems in your home.  Avoiding areas where people smoke.  Avoiding using strong perfumes or odor sprays.  Avoid spending a lot of time outdoors when pollen counts are high and on very windy days.  Talking with your health care provider before stopping or starting any new medicines. Medicines Take over-the-counter and prescription medicines only as told by your health care provider. Many asthma attacks can be prevented by carefully following your medicine schedule. Taking your medicines correctly is especially important when you cannot avoid certain asthma triggers. Even if you are doing well, do not stop taking your medicine and do not take less medicine. Act quickly If an asthma attack happens, acting quickly can decrease how severe it is and how long it lasts. Take these actions:  Pay attention to your symptoms. If you are coughing, wheezing, or having difficulty breathing, do not wait to see if your symptoms go away on their own. Follow your asthma action plan.  If you have followed your asthma action plan and your symptoms are not improving, call your health care provider or seek immediate medical care at the nearest hospital. It is important  to write down how often you need to use your fast-acting rescue inhaler. You can track how often you use an inhaler in your journal. If you are using your rescue inhaler more often, it may mean that your asthma is not under control. Adjusting your asthma treatment plan may help you to prevent future asthma attacks and help you to gain better control of your condition. How can I prevent an asthma attack when I exercise? Exercise is a common asthma trigger. To prevent asthma attacks during exercise:  Follow advice from your health care provider about whether you should use your fast-acting inhaler before exercising. Many people with asthma experience exercise-induced bronchoconstriction (EIB). This condition often worsens during vigorous exercise in cold, humid, or dry environments.  Usually, people with EIB can stay very active by using a fast-acting inhaler before exercising.  Avoid exercising outdoors in very cold or humid weather.  Avoid exercising outdoors when pollen counts are high.  Warm up and cool down when exercising.  Stop exercising right away if asthma symptoms start. Consider taking part in exercises that are less likely to cause asthma symptoms such as:  Indoor swimming.  Biking.  Walking.  Hiking.  Playing football. This information is not intended to replace advice given to you by your health care provider. Make sure you discuss any questions you have with your health care provider. Document Released: 10/17/2009 Document Revised: 10/11/2017 Document Reviewed: 04/14/2016 Elsevier Patient Education  2020 [employer]. https://www.cdc.gov/vaccines/hcp/vis/vis-statements/tdap.pdf">  Tdap Vaccine (Tetanus, Diphtheria and Pertussis): What You Need to Know 1. Why get vaccinated? Tetanus, diphtheria and pertussis are very serious diseases. Tdap vaccine can protect [place] from these diseases. And, Tdap vaccine given to pregnant women can protect newborn babies against  pertussis.[employer] TETANUS (Lockjaw) is rare in the [place] today. It causes painful muscle tightening and stiffness, usually all over the body.  It can lead to tightening of muscles in the head and neck so you can't open your mouth, swallow, or sometimes even breathe. Tetanus kills about 1 out of 10 people who are infected even after receiving the best medical care. DIPHTHERIA is also rare in the [place] today. It can cause a thick coating to form in the back of the throat.  It can lead to breathing problems, heart failure, paralysis, and death. PERTUSSIS (Whooping Cough) causes severe coughing spells, which can cause difficulty breathing, vomiting and disturbed sleep.  It can also lead to weight loss, incontinence, and rib fractures. Up to 2 in 100 adolescents and 5 in 100 adults with pertussis are hospitalized or have complications, which could include pneumonia or death. These diseases are caused by bacteria. Diphtheria and pertussis are spread from person to person through secretions from coughing or sneezing. Tetanus enters the body through cuts, scratches, or wounds. Before vaccines, as many as 200,000 cases of diphtheria, 200,000 cases of pertussis, and hundreds of cases of tetanus, were reported in the [place] each year. Since vaccination began, reports of cases for tetanus and diphtheria have dropped by about 99% and for pertussis by about 80%. 2. Tdap vaccine Tdap vaccine can protect adolescents and adults from tetanus, diphtheria, and pertussis. One dose of Tdap is routinely given at age 52 or 76. People who did not get Tdap at that age should get it as soon as possible. Tdap is especially important for healthcare professionals and anyone having close contact with a baby younger than 12 months. Pregnant women should get a dose of Tdap during every pregnancy, to protect the newborn from pertussis. Infants are most at risk for severe, life-threatening complications from  pertussis. Another vaccine, called Td, protects against tetanus and diphtheria, but not pertussis. A Td booster should be given every 10 years. Tdap may be given as one of these boosters if you have never gotten Tdap before. Tdap may also be given after a severe cut or burn to prevent tetanus infection. Your doctor or the person giving you the vaccine can give you more information. Tdap may safely be given at the same time as other vaccines. 3. Some people should not get this vaccine  A person who has ever had a life-threatening allergic reaction after a previous dose of any diphtheria, tetanus or pertussis containing vaccine, OR has  a severe allergy to any part of this vaccine, should not get Tdap vaccine. Tell the person giving the vaccine about any severe allergies.  Anyone who had coma or long repeated seizures within 7 days after a childhood dose of DTP or DTaP, or a previous dose of Tdap, should not get Tdap, unless a cause other than the vaccine was found. They can still get Td.  Talk to your doctor if you: ? have seizures or another nervous system problem, ? had severe pain or swelling after any vaccine containing diphtheria, tetanus or pertussis, ? ever had a condition called Guillain-Barr Syndrome (GBS), ? aren't feeling well on the day the shot is scheduled. 4. Risks With any medicine, including vaccines, there is a chance of side effects. These are usually mild and go away on their own. Serious reactions are also possible but are rare. Most people who get Tdap vaccine do not have any problems with it. Mild problems following Tdap (Did not interfere with activities)  Pain where the shot was given (about 3 in 4 adolescents or 2 in 3 adults)  Redness or swelling where the shot was given (about 1 person in 5)  Mild fever of at least 100.78F (up to about 1 in 25 adolescents or 1 in 100 adults)  Headache (about 3 or 4 people in 10)  Tiredness (about 1 person in 3 or 4)  Nausea,  vomiting, diarrhea, stomach ache (up to 1 in 4 adolescents or 1 in 10 adults)  Chills, sore joints (about 1 person in 10)  Body aches (about 1 person in 3 or 4)  Rash, swollen glands (uncommon) Moderate problems following Tdap (Interfered with activities, but did not require medical attention)  Pain where the shot was given (up to 1 in 5 or 6)  Redness or swelling where the shot was given (up to about 1 in 16 adolescents or 1 in 12 adults)  Fever over 102F (about 1 in 100 adolescents or 1 in 250 adults)  Headache (about 1 in 7 adolescents or 1 in 10 adults)  Nausea, vomiting, diarrhea, stomach ache (up to 1 or 3 people in 100)  Swelling of the entire arm where the shot was given (up to about 1 in 500). Severe problems following Tdap (Unable to perform usual activities; required medical attention)  Swelling, severe pain, bleeding and redness in the arm where the shot was given (rare). Problems that could happen after any vaccine:  People sometimes faint after a medical procedure, including vaccination. Sitting or lying down for about 15 minutes can help prevent fainting, and injuries caused by a fall. Tell your doctor if you feel dizzy, or have vision changes or ringing in the ears.  Some people get severe pain in the shoulder and have difficulty moving the arm where a shot was given. This happens very rarely.  Any medication can cause a severe allergic reaction. Such reactions from a vaccine are very rare, estimated at fewer than 1 in a million doses, and would happen within a few minutes to a few hours after the vaccination. As with any medicine, there is a very remote chance of a vaccine causing a serious injury or death. The safety of vaccines is always being monitored. For more information, visit: http://www.aguilar.org/ 5. What if there is a serious problem? What should I look for?  Look for anything that concerns you, such as signs of a severe allergic reaction, very  high fever, or unusual behavior. Signs of a severe  allergic reaction can include hives, swelling of the face and throat, difficulty breathing, a fast heartbeat, dizziness, and weakness. These would usually start a few minutes to a few hours after the vaccination. What should I do?  If you think it is a severe allergic reaction or other emergency that can't wait, call 9-1-1 or get the person to the nearest hospital. Otherwise, call your doctor.  Afterward, the reaction should be reported to the Vaccine Adverse Event Reporting System (VAERS). Your doctor might file this report, or you can do it yourself through the VAERS web site at www.vaers.SamedayNews.es, or by calling 778-562-4403. VAERS does not give medical advice. 6. The National Vaccine Injury Compensation Program The [employer] Vaccine Injury Compensation Program (VICP) is a federal program that was created to compensate people who may have been injured by certain vaccines. Persons who believe they may have been injured by a vaccine can learn about the program and about filing a claim by calling (540)433-3566 or visiting the [place] website at GoldCloset.com.ee. There is a time limit to file a claim for compensation. 7. How can I learn more?  Ask your doctor. He or she can give you the vaccine package insert or suggest other sources of information.  Call your local or state health department.  Contact the Centers for Disease Control and Prevention (CDC): ? Call 2513507347 (1-800-CDC-INFO) or ? Visit CDC's website at http://hunter.com/ Vaccine Information Statement Tdap Vaccine (01/05/2014) This information is not intended to replace advice given to you by your health care provider. Make sure you discuss any questions you have with your health care provider. Document Released: 04/29/2012 Document Revised: 06/16/2018 Document Reviewed: 06/16/2018 Elsevier Interactive Patient Education  2020 [place]  Screening  Colorectal cancer screening is a group of tests that are used to check for colorectal cancer before symptoms develop. Colorectal refers to the colon and rectum. The colon and rectum are located at the end of the digestive tract and carry bowel movements out of the body. Who should have screening? All adults starting at age 20 until age 27 should have screening. Your health care provider may recommend screening at age 48. You will have tests every 1-10 years, depending on your results and the type of screening test. You may have screening tests starting at an earlier age, or more frequently than other people, if you have any of the following risk factors:  A personal or family history of colorectal cancer or abnormal growths (polyps).  Inflammatory bowel disease, such as ulcerative colitis or Crohn's disease.  A history of having radiation treatment to the abdomen or pelvic area for cancer.  Colorectal cancer symptoms, such as changes in bowel habits or blood in your stool.  A type of colon cancer syndrome that is passed from parent to child (hereditary), such as: ? Lynch syndrome. ? Familial adenomatous polyposis. ? Turcot syndrome. ? Peutz-Jeghers syndrome. Screening recommendations for adults who are 20-49 years old vary depending on health. How is screening done? There are several types of colorectal screening tests. You may have one or more of the following:  Guaiac-based fecal occult blood testing. For this test, a stool (feces) sample is checked for hidden (occult) blood, which could be a sign of colorectal cancer.  Fecal immunochemical test (FIT). For this test, a stool sample is checked for blood, which could be a sign of colorectal cancer.  Stool DNA test. For this test, a stool sample is checked for blood and changes in DNA that  could lead to colorectal cancer.  Sigmoidoscopy. During this test, a thin, flexible tube with a camera on the end (sigmoidoscope) is used  to examine the rectum and the lower colon.  Colonoscopy. During this test, a long, flexible tube with a camera on the end (colonoscope) is used to examine the entire colon and rectum. With a colonoscopy, it is possible to take a sample of tissue (biopsy) and remove small polyps during the test.  Virtual colonoscopy. Instead of a colonoscope, this type of colonoscopy uses X-rays (CT scan) and computers to produce images of the colon and rectum. What are the benefits of screening? Screening reduces your risk for colorectal cancer and can help identify cancer at an early stage, when the cancer can be removed or treated more easily. It is common for polyps to form in the lining of the colon, especially as you age. These polyps may be cancerous or become cancerous over time. Screening can identify these polyps. What are the risks of screening? Each screening test may have different risks.  Stool sample tests have fewer risks than other types of screening tests. However, you may need more tests to confirm results from a stool sample test.  Screening tests that involve X-rays expose you to low levels of radiation, which may slightly increase your cancer risk. The benefit of detecting cancer outweighs the slight increase in risk.  Screening tests such as sigmoidoscopy and colonoscopy may place you at risk for bleeding, intestinal damage, infection, or a reaction to medicines given during the exam. Talk with your health care provider to understand your risk for colorectal cancer and to make a screening plan that is right for you. Questions to ask your health care provider  When should I start colorectal cancer screening?  What is my risk for colorectal cancer?  How often do I need screening?  Which screening tests do I need?  How do I get my test results?  What do my results mean? Where to find more information Learn more about colorectal cancer screening from:  The [employer]:  www.cancer.org  The [employer]: www.cancer.gov Summary  Colorectal cancer screening is a group of tests used to check for colorectal cancer before symptoms develop.  Screening reduces your risk for colorectal cancer and can help identify cancer at an early stage, when the cancer can be removed or treated more easily.  All adults starting at age 3 until age 36 should have screening. Your health care provider may recommend screening at age 91.  You may have screening tests starting at an earlier age, or more frequently than other people, if you have certain risk factors.  Talk with your health care provider to understand your risk for colorectal cancer and to make a screening plan that is right for you. This information is not intended to replace advice given to you by your health care provider. Make sure you discuss any questions you have with your health care provider. Document Released: 04/18/2010 Document Revised: 02/18/2019 Document Reviewed: 07/31/2017 Elsevier Patient Education  2020 [employer].

## 2019-06-17 NOTE — Telephone Encounter (Signed)
Order placed at [facility]

## 2019-06-17 NOTE — Progress Notes (Signed)
Subjective:    Natalie Moran is a 68 y.o. female who presents for Medicare Initial preventive examination.  Preventive Screening-Counseling & Management  Tobacco Social History   Tobacco Use  Smoking Status Never Smoker  Smokeless Tobacco Never Used     Problems Prior to Visit 1. Pain in hands:  Pt notes hands starting to "look like her mother's".  Pt may take tylenol prn for pain in joints of fingers.  Notes stiffness and mild deformity. 2. Asthma/allergies:  Seeing Dr. [place] CallasSharma.  Receiving allergy injections to 10 days.  States has been doing well this season.  Albuterol inhaler as needed 3.  HTN: Taking Norvasc 5 mg daily  Current Problems (verified) Patient Active Problem List   Diagnosis Date Noted  . Moderate persistent asthma without complication 12/09/2018  . Essential hypertension 12/09/2018    Medications Prior to Visit Current Outpatient Medications on File Prior to Visit  Medication Sig Dispense Refill  . albuterol (PROVENTIL HFA;VENTOLIN HFA) 108 (90 Base) MCG/ACT inhaler Inhale 1 puff into the lungs as needed for wheezing or shortness of breath.    Marland Kitchen. amLODipine (NORVASC) 5 MG tablet Take 1 tablet (5 mg total) by mouth daily. 30 tablet 1  . EPINEPHrine 0.3 mg/0.3 mL IJ SOAJ injection Inject 0.3 mg into the muscle as needed for anaphylaxis.    Marland Kitchen. estradiol (ESTRACE) 0.1 MG/GM vaginal cream Place 1 Applicatorful vaginally every 7 (seven) days.    . Fluticasone-Salmeterol (ADVAIR DISKUS) 250-50 MCG/DOSE AEPB Inhale 1 puff into the lungs 2 (two) times daily.    . simvastatin (ZOCOR) 20 MG tablet Take 20 mg by mouth daily.    . zafirlukast (ACCOLATE) 20 MG tablet Take 20 mg by mouth 2 (two) times daily before a meal.     No current facility-administered medications on file prior to visit.     Current Medications (verified) Current Outpatient Medications  Medication Sig Dispense Refill  . albuterol (PROVENTIL HFA;VENTOLIN HFA) 108 (90 Base) MCG/ACT inhaler Inhale 1  puff into the lungs as needed for wheezing or shortness of breath.    Marland Kitchen. amLODipine (NORVASC) 5 MG tablet Take 1 tablet (5 mg total) by mouth daily. 30 tablet 1  . EPINEPHrine 0.3 mg/0.3 mL IJ SOAJ injection Inject 0.3 mg into the muscle as needed for anaphylaxis.    Marland Kitchen. estradiol (ESTRACE) 0.1 MG/GM vaginal cream Place 1 Applicatorful vaginally every 7 (seven) days.    . Fluticasone-Salmeterol (ADVAIR DISKUS) 250-50 MCG/DOSE AEPB Inhale 1 puff into the lungs 2 (two) times daily.    . simvastatin (ZOCOR) 20 MG tablet Take 20 mg by mouth daily.    . zafirlukast (ACCOLATE) 20 MG tablet Take 20 mg by mouth 2 (two) times daily before a meal.     No current facility-administered medications for this visit.      Allergies (verified) Codeine, Lisinopril, Niacin and related, and Wixela inhub [fluticasone-salmeterol]   PAST HISTORY  Family History History reviewed. No pertinent family history.  Social History Social History   Tobacco Use  . Smoking status: Never Smoker  . Smokeless tobacco: Never Used  Substance Use Topics  . Alcohol use: No     Are there smokers in your home (other than you)? No  Risk Factors Current exercise habits: The patient does not participate in regular exercise at present. Also limited 2/2 COVID-19 pandemic Dietary issues discussed: eating a variety of fruits and vegetables  Cardiac risk factors: advanced age (older than 11055 for men, 5365 for women) and sedentary  lifestyle.  Depression Screen (Note: if answer to either of the following is "Yes", a more complete depression screening is indicated)   Over the past 2 weeks, have you felt down, depressed or hopeless? No  Over the past 2 weeks, have you felt little interest or pleasure in doing things? No  Have you lost interest or pleasure in daily life? No  Do you often feel hopeless? No  Do you cry easily over simple problems? No  Activities of Daily Living In your present state of health, do you have any  difficulty performing the following activities?:  Driving? No Managing money?  No Feeding yourself? No Getting from bed to chair? No Climbing a flight of stairs? No Preparing food and eating?: No Bathing or showering? No Getting dressed: No Getting to the toilet? No Using the toilet:No Moving around from place to place: No In the past year have you fallen or had a near fall?:No   Are you sexually active?  Yes  Do you have more than one partner?  No  Hearing Difficulties: No Do you often ask people to speak up or repeat themselves? No Do you experience ringing or noises in your ears? No Do you have difficulty understanding soft or whispered voices? No   Do you feel that you have a problem with memory? No  Do you often misplace items? No  Do you feel safe at home?  Yes  Cognitive Testing  Alert? Yes  Normal Appearance?Yes  Oriented to person? Yes  Place? Yes   Time? Yes  Recall of three objects?  Yes  Can perform simple calculations? Yes  Displays appropriate judgment?Yes  Can read the correct time from a watch face?Yes   Advanced Directives have been discussed with the patient? Yes  List the Names of Other Physician/Practitioners you currently use: 1.  Dr. Gershon Crane, vision, last visit 12/11/2018 2.  Dr. Rolm Bookbinder, dermatology, last visit 03/04/2019 3.  Dr. Donneta Romberg, asthma allergy specialist, last visit 2019 4.  Dr. Janyth Pupa, OB/GYN, last visit November 2019  Indicate any recent Medical Services you may have received from other than [facility] providers in the past year (date may be approximate).  Immunization History  Administered Date(s) Administered  . Influenza, High Dose Seasonal PF 08/03/2018    Screening Tests Health Maintenance  Topic Date Due  . Hepatitis C Screening  [birth year]  . HIV Screening  07/18/1968  . TETANUS/TDAP  07/18/1972  . COLONOSCOPY  07/19/2003  . DEXA SCAN  07/18/2018  . PNA vac Low Risk Adult (1 of 2 - PCV13) 07/18/2018  . INFLUENZA  VACCINE  06/13/2019  . PAP SMEAR-Modifier  09/29/2019  . MAMMOGRAM  07/23/2020    All answers were reviewed with the patient and necessary referrals were made:  Billie Ruddy, MD   06/17/2019   History reviewed: allergies, current medications, past family history, past medical history, past social history, past surgical history and problem list  Review of Systems Pertinent items noted in HPI and remainder of comprehensive ROS otherwise negative.    Objective:      Body mass index is 28.6 kg/m. BP (!) 148/80   Pulse 82   Temp 98.6 F (37 C) (Oral)   Ht 5\' 7"  (1.702 m)   Wt 182 lb 9.6 oz (82.8 kg)   SpO2 96%   BMI 28.60 kg/m   General appearance: alert, cooperative and no distress Head: Normocephalic, without obvious abnormality, atraumatic Eyes: conjunctivae/corneas clear. PERRL, EOM's intact. Fundi benign. Ears:  normal TM's and external ear canals both ears Nose: Nares normal. Septum midline. Mucosa normal. No drainage or sinus tenderness. Throat: lips, mucosa, and tongue normal; teeth and gums normal Neck: no adenopathy, no carotid bruit, no JVD, supple, symmetrical, trachea midline and thyroid not enlarged, symmetric, no tenderness/mass/nodules Lungs: clear to auscultation bilaterally Heart: regular rate and rhythm, S1, S2 normal, no murmur, click, rub or gallop Abdomen: soft, non-tender; bowel sounds normal; no masses,  no organomegaly Extremities: extremities normal, atraumatic, no cyanosis or edema and mild nodules noted on DIP joints of fingers of L hand and 2nd digit of R hand.  no edema or increased warmth.  ROM normal. Pulses: 2+ and symmetric Skin: Skin color, texture, turgor normal. No rashes or lesions Lymph nodes: Cervical, supraclavicular, and axillary nodes normal. Neurologic: Alert and oriented X 3, normal strength and tone. Normal symmetric reflexes. Normal coordination and gait     Assessment:     Pt is a healthy 68 yo with b/l hand pain likely 2/2  arthritis.     Plan:     During the course of the visit the patient was educated and counseled about appropriate screening and preventive services including:    Pneumococcal vaccine   Td vaccine  Bone densitometry screening  Colorectal cancer screening  Advanced directives: has NO advanced directive  - add't info requested. Referral to SW: not applicable  Given packet of info  Diet review for nutrition referral? Yes ____  Not Indicated __x__  Need for Tdap vaccination  - Plan: Tdap vaccine greater than or equal to 7yo IM  Need for pneumococcal vaccination  - Plan: Pneumococcal polysaccharide vaccine 23-valent greater than or equal to 2yo subcutaneous/IM  Colon cancer screening  -discussed r/b/a -Plan: Cologuard  Arthralgia of both hands  -discussed possible causes arthritis (OA vs RA),  -discussed medication options Tylenol, etc - Plan: DG Hand Complete Left  Essential hypertension  -elevated -continue norvasc 5 mg daily  Moderate persistent asthma without complication  -Continue follow-up with asthma and allergy specialist, Dr. [place] CallasSharma -Continue use of inhalers prn   Patient Instructions (the written plan) was given to the patient.  Medicare Attestation I have personally reviewed: The patient's medical and social history Their use of alcohol, tobacco or illicit drugs Their current medications and supplements The patient's functional ability including ADLs,fall risks, home safety risks, cognitive, and hearing and visual impairment Diet and physical activities Evidence for depression or mood disorders  The patient's weight, height, BMI, and visual acuity have been recorded in the chart.  I have made referrals, counseling, and provided education to the patient based on review of the above and I have provided the patient with a written personalized care plan for preventive services.     Deeann SaintShannon R Liliana Brentlinger, MD   06/17/2019

## 2019-06-18 ENCOUNTER — Encounter: Payer: Self-pay | Admitting: Family Medicine

## 2019-07-29 ENCOUNTER — Other Ambulatory Visit: Payer: Self-pay

## 2019-07-29 ENCOUNTER — Ambulatory Visit
Admission: RE | Admit: 2019-07-29 | Discharge: 2019-07-29 | Disposition: A | Discharge status: Home / Self Care | Payer: Medicare Other | Source: Ambulatory Visit | Attending: Obstetrics & Gynecology | Admitting: Obstetrics & Gynecology

## 2019-07-29 DIAGNOSIS — Z1231 Encounter for screening mammogram for malignant neoplasm of breast: Secondary | ICD-10-CM

## 2019-08-24 ENCOUNTER — Other Ambulatory Visit: Payer: Self-pay

## 2019-08-24 ENCOUNTER — Other Ambulatory Visit: Payer: Self-pay | Admitting: Family Medicine

## 2019-08-27 ENCOUNTER — Other Ambulatory Visit: Payer: Self-pay | Admitting: Family Medicine

## 2019-12-01 ENCOUNTER — Ambulatory Visit: Payer: Medicare Other | Attending: Internal Medicine

## 2019-12-01 DIAGNOSIS — Z23 Encounter for immunization: Secondary | ICD-10-CM | POA: Insufficient documentation

## 2019-12-01 NOTE — Progress Notes (Signed)
   Covid-19 Vaccination Clinic  Name:  Natalie Moran    MRN: [id number] DOB: 1951/04/21  12/01/2019  [name] was observed post Covid-19 immunization for 15 minutes without incidence. She was provided with Vaccine Information Sheet and instruction to access the V-Safe system.   [name] was instructed to call 911 with any severe reactions post vaccine: Marland Kitchen Difficulty breathing  . Swelling of your face and throat  . A fast heartbeat  . A bad rash all over your body  . Dizziness and weakness    Immunizations Administered    Name Date Dose VIS Date Route   Pfizer COVID-19 Vaccine 12/01/2019 11:36 AM 0.3 mL 10/23/2019 Intramuscular   Manufacturer: [employer], [employer]   Lot: [id number]   NDC: [address]-1

## 2019-12-21 ENCOUNTER — Ambulatory Visit: Payer: Medicare Other | Attending: Internal Medicine

## 2019-12-21 DIAGNOSIS — Z23 Encounter for immunization: Secondary | ICD-10-CM

## 2019-12-21 NOTE — Progress Notes (Signed)
   Covid-19 Vaccination Clinic  Name:  NILAH BELCOURT    MRN: [id number] DOB: [birth year]  12/21/2019  Ms. Ackert was observed post Covid-19 immunization for 15 minutes without incidence. She was provided with Vaccine Information Sheet and instruction to access the V-Safe system.   Ms. Panameno was instructed to call 911 with any severe reactions post vaccine: Marland Kitchen Difficulty breathing  . Swelling of your face and throat  . A fast heartbeat  . A bad rash all over your body  . Dizziness and weakness    Immunizations Administered    Name Date Dose VIS Date Route   Pfizer COVID-19 Vaccine 12/21/2019  9:42 AM 0.3 mL 10/23/2019 Intramuscular   Manufacturer: [employer], [employer]   Lot: [id number]   NDC: [address]-5

## 2020-02-17 ENCOUNTER — Other Ambulatory Visit: Payer: Self-pay | Admitting: Family Medicine

## 2020-05-25 ENCOUNTER — Other Ambulatory Visit: Payer: Self-pay | Admitting: Family Medicine

## 2020-06-17 ENCOUNTER — Other Ambulatory Visit: Payer: Self-pay

## 2020-06-17 ENCOUNTER — Ambulatory Visit (INDEPENDENT_AMBULATORY_CARE_PROVIDER_SITE_OTHER): Payer: Medicare Other | Admitting: Family Medicine

## 2020-06-17 ENCOUNTER — Encounter: Payer: Self-pay | Admitting: Family Medicine

## 2020-06-17 VITALS — BP 138/80 | HR 80 | Temp 98.1°F | Ht 67.0 in | Wt 178.0 lb

## 2020-06-17 DIAGNOSIS — I10 Essential (primary) hypertension: Secondary | ICD-10-CM | POA: Diagnosis not present

## 2020-06-17 DIAGNOSIS — F439 Reaction to severe stress, unspecified: Secondary | ICD-10-CM | POA: Diagnosis not present

## 2020-06-17 DIAGNOSIS — Z1211 Encounter for screening for malignant neoplasm of colon: Secondary | ICD-10-CM

## 2020-06-17 DIAGNOSIS — Z780 Asymptomatic menopausal state: Secondary | ICD-10-CM

## 2020-06-17 DIAGNOSIS — J4540 Moderate persistent asthma, uncomplicated: Secondary | ICD-10-CM | POA: Diagnosis not present

## 2020-06-17 DIAGNOSIS — Z0000 Encounter for general adult medical examination without abnormal findings: Secondary | ICD-10-CM | POA: Diagnosis not present

## 2020-06-17 NOTE — Progress Notes (Signed)
Subjective:   Natalie Moran is a 69 y.o. female who presents for Medicare Annual (Subsequent) preventive examination.  Pt doing well overall.  Feels like her asthma has become worse since having to wear a mask 2/2 COVID 19 pandemic.  Pt followed by Pulm.  Taking Advair, zafirlukast, and albuterol prn.  Taking norvasc 5 mg for HTN.  Pt under increased stress 2/2 ongoing family issues.  Pt is worried about her sister.      Objective:    Today's Vitals   06/17/20 1314  BP: 138/80  Pulse: 80  Temp: 98.1 F (36.7 C)  TempSrc: Oral  SpO2: 98%  Weight: 178 lb (80.7 kg)  Height: '5\' 7"'$  (1.702 m)   Body mass index is 27.88 kg/m.  No flowsheet data found.  Gen. Pleasant, well developed, well-nourished, in NAD HEENT - [place]/AT, PERRL, EOMI, no scleral icterus, no nasal drainage, pharynx without erythema or exudate. Lungs: no use of accessory muscles, CTAB, no wheezes, rales or rhonchi Cardiovascular: RRR,No r/g/m, no peripheral edema Abdomen: BS present, soft, nontender,nondistended Musculoskeletal: No deformities, moves all four extremities, no cyanosis or clubbing, normal tone Neuro:  A&Ox3, CN II-XII intact, normal gait Skin:  Warm, dry, intact, no lesions Psych: increased anxiety, mildly depressed mood.   Current Medications (verified) Outpatient Encounter Medications as of 06/17/2020  Medication Sig  . albuterol (PROVENTIL HFA;VENTOLIN HFA) 108 (90 Base) MCG/ACT inhaler Inhale 1 puff into the lungs as needed for wheezing or shortness of breath.  Marland Kitchen amLODipine (NORVASC) 5 MG tablet TAKE 1 TABLET BY MOUTH EVERY DAY  . EPINEPHrine 0.3 mg/0.3 mL IJ SOAJ injection Inject 0.3 mg into the muscle as needed for anaphylaxis.  Marland Kitchen estradiol (ESTRACE) 0.1 MG/GM vaginal cream Place 1 Applicatorful vaginally every 7 (seven) days.  . Fluticasone-Salmeterol (ADVAIR DISKUS) 250-50 MCG/DOSE AEPB Inhale 1 puff into the lungs 2 (two) times daily.  . simvastatin (ZOCOR) 20 MG tablet TAKE 1 TABLET BY  MOUTH EVERYDAY AT BEDTIME  . zafirlukast (ACCOLATE) 20 MG tablet Take 20 mg by mouth 2 (two) times daily before a meal.   No facility-administered encounter medications on file as of 06/17/2020.    Allergies (verified) Codeine, Lisinopril, Niacin and related, and Wixela inhub [fluticasone-salmeterol]   History: Past Medical History:  Diagnosis Date  . Asthma   . Hypercholesterolemia   . Hypertension    Past Surgical History:  Procedure Laterality Date  . APPENDECTOMY    . BREAST EXCISIONAL BIOPSY Left   . BREAST SURGERY    . CESAREAN SECTION    . EYE SURGERY     History reviewed. No pertinent family history. Social History   Socioeconomic History  . Marital status: Married    Spouse name: Not on file  . Number of children: Not on file  . Years of education: Not on file  . Highest education level: Not on file  Occupational History  . Not on file  Tobacco Use  . Smoking status: Never Smoker  . Smokeless tobacco: Never Used  Substance and Sexual Activity  . Alcohol use: No  . Drug use: Never  . Sexual activity: Not on file  Other Topics Concern  . Not on file  Social History Narrative  . Not on file   Social Determinants of Health   Financial Resource Strain:   . Difficulty of Paying Living Expenses:   Food Insecurity:   . Worried About Charity fundraiser in the Last Year:   . [employer]  in the Last Year:   Transportation Needs:   . Film/video editor (Medical):   Marland Kitchen Lack of Transportation (Non-Medical):   Physical Activity:   . Days of Exercise per Week:   . Minutes of Exercise per Session:   Stress:   . Feeling of Stress :   Social Connections:   . Frequency of Communication with Friends and Family:   . Frequency of Social Gatherings with Friends and Family:   . Attends Religious Services:   . Active Member of Clubs or Organizations:   . Attends Archivist Meetings:   Marland Kitchen Marital Status:     Tobacco Counseling Counseling given: No     Activities of Daily Living No flowsheet data found.  Patient Care Team: Billie Ruddy, MD as PCP - General (Family Medicine)  Indicate any recent Medical Services you may have received from other than [facility] providers in the past year (date may be approximate).     Assessment:   This is a routine wellness examination for Natalie Moran.  Hearing/Vision screen  Hearing Screening   '125Hz'$  '250Hz'$  '500Hz'$  '1000Hz'$  '2000Hz'$  '3000Hz'$  '4000Hz'$  '6000Hz'$  '8000Hz'$   Right ear:   Pass Pass Pass  Pass    Left ear:   Pass Pass Pass  Pass      Visual Acuity Screening   Right eye Left eye Both eyes  Without correction: '20/40 20/40 20/30 '$  With correction:       Dietary issues and exercise activities discussed:   Depression Screen PHQ 2/9 Scores 06/17/2020 06/18/2019  PHQ - 2 Score 0 0    Fall Risk Fall Risk  06/17/2020  Falls in the past year? 0    Any stairs in or around the home? Yes  If so, are there any without handrails? No  Home free of loose throw rugs in walkways, pet beds, electrical cords, etc? Yes  Adequate lighting in your home to reduce risk of falls? Yes   ASSISTIVE DEVICES UTILIZED TO PREVENT FALLS:  Life alert? No  Use of a cane, walker or w/c? No  Grab bars in the bathroom? No  Shower chair or bench in shower? No  Elevated toilet seat or a handicapped toilet? No   TIMED UP AND GO:  Was the test performed? No .   Gait steady and fast without use of assistive device  Cognitive Function:  Immunizations Immunization History  Administered Date(s) Administered  . Influenza, High Dose Seasonal PF 08/03/2018  . PFIZER SARS-COV-2 Vaccination 12/01/2019, 12/21/2019  . Pneumococcal Polysaccharide-23 06/17/2019  . Tdap 06/17/2019    TDAP status: Up to date Flu Vaccine status: due in a few months Pneumococcal vaccine status: Up to date Covid-19 vaccine status: Completed vaccines  Qualifies for Shingles Vaccine? Yes   Zostavax completed No   Shingrix Completed?: No.    Education has  been provided regarding the importance of this vaccine. Patient has been advised to call insurance company to determine out of pocket expense if they have not yet received this vaccine. Advised may also receive vaccine at local pharmacy or Health Dept. Verbalized acceptance and understanding.  Screening Tests Health Maintenance  Topic Date Due  . Hepatitis C Screening  Never done  . COLONOSCOPY  Never done  . DEXA SCAN  07/18/2018  . INFLUENZA VACCINE  06/12/2020  . PNA vac Low Risk Adult (2 of 2 - PCV13) 06/16/2020  . MAMMOGRAM  07/28/2021  . TETANUS/TDAP  06/16/2029  . COVID-19 Vaccine  Completed    Health Maintenance  Health Maintenance Due  Topic Date Due  . Hepatitis C Screening  Never done  . COLONOSCOPY  Never done  . DEXA SCAN  07/18/2018  . INFLUENZA VACCINE  06/12/2020  . PNA vac Low Risk Adult (2 of 2 - PCV13) 06/16/2020    Colorectal cancer screening:  Pt prefers cologaurd. Mammogram status: Pt provided with contact info and advised to call to schedule appt.  Bone Density status: Ordered this visit. Pt provided with contact info and advised to call to schedule appt.  Lung Cancer Screening: (Low Dose CT Chest recommended if Age 80-80 years, 30 pack-year currently smoking OR have quit w/in 15years.) does not qualify.   Lung Cancer Screening Referral: n/a  Additional Screening:  Hepatitis C Screening: does qualify  Vision Screening: Recommended annual ophthalmology exams for early detection of glaucoma and other disorders of the eye. Is the patient up to date with their annual eye exam?  to be scheduled  Dental Screening: Recommended annual dental exams for proper oral hygiene    Plan:     I have personally reviewed and noted the following in the patient's chart:   . Medical and social history . Use of alcohol, tobacco or illicit drugs  . Current medications and supplements . Functional ability and status . Nutritional status . Physical  activity . Advanced directives . List of other physicians . Hospitalizations, surgeries, and ER visits in previous 12 months . Vitals . Screenings to include cognitive, depression, and falls . Referrals and appointments  In addition, I have reviewed and discussed with patient certain preventive protocols, quality metrics, and best practice recommendations. A written personalized care plan for preventive services as well as general preventive health recommendations were provided to patient.   Essential hypertension  -mildly elevated -continue norvasc 5 mg -discussed lifestyle modifications - Plan: CMP with eGFR([facility]), Lipid panel, CBC (no diff), TSH, TSH, CBC (no diff), Lipid panel, CMP with eGFR([facility])  Moderate persistent asthma without complication -controlled -continue albuterol prn and advair daily  Stress  -discussed self care -pt encouraged to consider counseling - Plan: TSH, TSH  Colon cancer screening  - Plan: Cologuard  Postmenopausal  - Plan: DG Bone Density  F/u prn  Billie Ruddy, MD   06/22/2020

## 2020-06-17 NOTE — Patient Instructions (Signed)
Preventive Care 38 Years and Older, Female Preventive care refers to lifestyle choices and visits with your health care provider that can promote health and wellness. This includes:  A yearly physical exam. This is also called an annual well check.  Regular dental and eye exams.  Immunizations.  Screening for certain conditions.  Healthy lifestyle choices, such as diet and exercise. What can I expect for my preventive care visit? Physical exam Your health care provider will check:  Height and weight. These may be used to calculate body mass index (BMI), which is a measurement that tells if you are at a healthy weight.  Heart rate and blood pressure.  Your skin for abnormal spots. Counseling Your health care provider may ask you questions about:  Alcohol, tobacco, and drug use.  Emotional well-being.  Home and relationship well-being.  Sexual activity.  Eating habits.  History of falls.  Memory and ability to understand (cognition).  Work and work Statistician.  Pregnancy and menstrual history. What immunizations do I need?  Influenza (flu) vaccine  This is recommended every year. Tetanus, diphtheria, and pertussis (Tdap) vaccine  You may need a Td booster every 10 years. Varicella (chickenpox) vaccine  You may need this vaccine if you have not already been vaccinated. Zoster (shingles) vaccine  You may need this after age 33. Pneumococcal conjugate (PCV13) vaccine  One dose is recommended after age 33. Pneumococcal polysaccharide (PPSV23) vaccine  One dose is recommended after age 72. Measles, mumps, and rubella (MMR) vaccine  You may need at least one dose of MMR if you were born in 1957 or later. You may also need a second dose. Meningococcal conjugate (MenACWY) vaccine  You may need this if you have certain conditions. Hepatitis A vaccine  You may need this if you have certain conditions or if you travel or work in places where you may be exposed  to hepatitis A. Hepatitis B vaccine  You may need this if you have certain conditions or if you travel or work in places where you may be exposed to hepatitis B. Haemophilus influenzae type b (Hib) vaccine  You may need this if you have certain conditions. You may receive vaccines as individual doses or as more than one vaccine together in one shot (combination vaccines). Talk with your health care provider about the risks and benefits of combination vaccines. What tests do I need? Blood tests  Lipid and cholesterol levels. These may be checked every 5 years, or more frequently depending on your overall health.  Hepatitis C test.  Hepatitis B test. Screening  Lung cancer screening. You may have this screening every year starting at age 39 if you have a 30-pack-year history of smoking and currently smoke or have quit within the past 15 years.  Colorectal cancer screening. All adults should have this screening starting at age 36 and continuing until age 15. Your health care provider may recommend screening at age 23 if you are at increased risk. You will have tests every 1-10 years, depending on your results and the type of screening test.  Diabetes screening. This is done by checking your blood sugar (glucose) after you have not eaten for a while (fasting). You may have this done every 1-3 years.  Mammogram. This may be done every 1-2 years. Talk with your health care provider about how often you should have regular mammograms.  BRCA-related cancer screening. This may be done if you have a family history of breast, ovarian, tubal, or peritoneal cancers.  Other tests  Sexually transmitted disease (STD) testing.  Bone density scan. This is done to screen for osteoporosis. You may have this done starting at age 68. Follow these instructions at home: Eating and drinking  Eat a diet that includes fresh fruits and vegetables, whole grains, lean protein, and low-fat dairy products. Limit  your intake of foods with high amounts of sugar, saturated fats, and salt.  Take vitamin and mineral supplements as recommended by your health care provider.  Do not drink alcohol if your health care provider tells you not to drink.  If you drink alcohol: ? Limit how much you have to 0-1 drink a day. ? Be aware of how much alcohol is in your drink. In the [place] one drink equals one 12 oz bottle of beer (355 mL), one 5 oz glass of wine (148 mL), or one 1 oz glass of hard liquor (44 mL). Lifestyle  Take daily care of your teeth and gums.  Stay active. Exercise for at least 30 minutes on 5 or more days each week.  Do not use any products that contain nicotine or tobacco, such as cigarettes, e-cigarettes, and chewing tobacco. If you need help quitting, ask your health care provider.  If you are sexually active, practice safe sex. Use a condom or other form of protection in order to prevent STIs (sexually transmitted infections).  Talk with your health care provider about taking a low-dose aspirin or statin. What's next?  Go to your health care provider once a year for a well check visit.  Ask your health care provider how often you should have your eyes and teeth checked.  Stay up to date on all vaccines. This information is not intended to replace advice given to you by your health care provider. Make sure you discuss any questions you have with your health care provider. Document Revised: 10/23/2018 Document Reviewed: 10/23/2018 Elsevier Patient Education  2020 [employer].  Managing Your Hypertension Hypertension is commonly called high blood pressure. This is when the force of your blood pressing against the walls of your arteries is too strong. Arteries are blood vessels that carry blood from your heart throughout your body. Hypertension forces the heart to work harder to pump blood, and may cause the arteries to become narrow or stiff. Having untreated or uncontrolled  hypertension can cause heart attack, stroke, kidney disease, and other problems. What are blood pressure readings? A blood pressure reading consists of a higher number over a lower number. Ideally, your blood pressure should be below 120/80. The first ("top") number is called the systolic pressure. It is a measure of the pressure in your arteries as your heart beats. The second ("bottom") number is called the diastolic pressure. It is a measure of the pressure in your arteries as the heart relaxes. What does my blood pressure reading mean? Blood pressure is classified into four stages. Based on your blood pressure reading, your health care provider may use the following stages to determine what type of treatment you need, if any. Systolic pressure and diastolic pressure are measured in a unit called mm Hg. Normal  Systolic pressure: below 191.  Diastolic pressure: below 80. Elevated  Systolic pressure: 478-295.  Diastolic pressure: below 80. Hypertension stage 1  Systolic pressure: 621-308.  Diastolic pressure: 65-78. Hypertension stage 2  Systolic pressure: 469 or above.  Diastolic pressure: 90 or above. What health risks are associated with hypertension? Managing your hypertension is an important responsibility. Uncontrolled hypertension can lead to:  A heart attack.  A stroke.  A weakened blood vessel (aneurysm).  Heart failure.  Kidney damage.  Eye damage.  Metabolic syndrome.  Memory and concentration problems. What changes can I make to manage my hypertension? Hypertension can be managed by making lifestyle changes and possibly by taking medicines. Your health care provider will help you make a plan to bring your blood pressure within a normal range. Eating and drinking   Eat a diet that is high in fiber and potassium, and low in salt (sodium), added sugar, and fat. An example eating plan is called the DASH (Dietary Approaches to Stop Hypertension) diet. To eat  this way: ? Eat plenty of fresh fruits and vegetables. Try to fill half of your plate at each meal with fruits and vegetables. ? Eat whole grains, such as whole wheat pasta, brown rice, or whole grain bread. Fill about one quarter of your plate with whole grains. ? Eat low-fat diary products. ? Avoid fatty cuts of meat, processed or cured meats, and poultry with skin. Fill about one quarter of your plate with lean proteins such as fish, chicken without skin, beans, eggs, and tofu. ? Avoid premade and processed foods. These tend to be higher in sodium, added sugar, and fat.  Reduce your daily sodium intake. Most people with hypertension should eat less than 1,500 mg of sodium a day.  Limit alcohol intake to no more than 1 drink a day for nonpregnant women and 2 drinks a day for men. One drink equals 12 oz of beer, 5 oz of wine, or 1 oz of hard liquor. Lifestyle  Work with your health care provider to maintain a healthy body weight, or to lose weight. Ask what an ideal weight is for you.  Get at least 30 minutes of exercise that causes your heart to beat faster (aerobic exercise) most days of the week. Activities may include walking, swimming, or biking.  Include exercise to strengthen your muscles (resistance exercise), such as weight lifting, as part of your weekly exercise routine. Try to do these types of exercises for 30 minutes at least 3 days a week.  Do not use any products that contain nicotine or tobacco, such as cigarettes and e-cigarettes. If you need help quitting, ask your health care provider.  Control any long-term (chronic) conditions you have, such as high cholesterol or diabetes. Monitoring  Monitor your blood pressure at home as told by your health care provider. Your personal target blood pressure may vary depending on your medical conditions, your age, and other factors.  Have your blood pressure checked regularly, as often as told by your health care provider. Working  with your health care provider  Review all the medicines you take with your health care provider because there may be side effects or interactions.  Talk with your health care provider about your diet, exercise habits, and other lifestyle factors that may be contributing to hypertension.  Visit your health care provider regularly. Your health care provider can help you create and adjust your plan for managing hypertension. Will I need medicine to control my blood pressure? Your health care provider may prescribe medicine if lifestyle changes are not enough to get your blood pressure under control, and if:  Your systolic blood pressure is 130 or higher.  Your diastolic blood pressure is 80 or higher. Take medicines only as told by your health care provider. Follow the directions carefully. Blood pressure medicines must be taken as prescribed. The medicine does not  work as well when you skip doses. Skipping doses also puts you at risk for problems. Contact a health care provider if:  You think you are having a reaction to medicines you have taken.  You have repeated (recurrent) headaches.  You feel dizzy.  You have swelling in your ankles.  You have trouble with your vision. Get help right away if:  You develop a severe headache or confusion.  You have unusual weakness or numbness, or you feel faint.  You have severe pain in your chest or abdomen.  You vomit repeatedly.  You have trouble breathing. Summary  Hypertension is when the force of blood pumping through your arteries is too strong. If this condition is not controlled, it may put you at risk for serious complications.  Your personal target blood pressure may vary depending on your medical conditions, your age, and other factors. For most people, a normal blood pressure is less than 120/80.  Hypertension is managed by lifestyle changes, medicines, or both. Lifestyle changes include weight loss, eating a healthy,  low-sodium diet, exercising more, and limiting alcohol. This information is not intended to replace advice given to you by your health care provider. Make sure you discuss any questions you have with your health care provider. Document Revised: 02/20/2019 Document Reviewed: 09/26/2016 Elsevier Patient Education  [place], Adult Feeling a certain amount of stress is normal. Stress helps our body and mind get ready to deal with the demands of life. Stress hormones can motivate you to do well at work and meet your responsibilities. However severe or long-lasting (chronic) stress can affect your mental and physical health. Chronic stress puts you at higher risk for anxiety, depression, and other health problems like digestive problems, muscle aches, heart disease, high blood pressure, and stroke. What are the causes? Common causes of stress include:  Demands from work, such as deadlines, feeling overworked, or having long hours.  Pressures at home, such as money issues, disagreements with a spouse, or parenting issues.  Pressures from major life changes, such as divorce, moving, loss of a loved one, or chronic illness. You may be at higher risk for stress-related problems if you do not get enough sleep, are in poor health, do not have emotional support, or have a mental health disorder like anxiety or depression. How to recognize stress Stress can make you:  Have trouble sleeping.  Feel sad, anxious, irritable, or overwhelmed.  Lose your appetite.  Overeat or want to eat unhealthy foods.  Want to use drugs or alcohol. Stress can also cause physical symptoms, such as:  Sore, tense muscles, especially in the shoulders and neck.  Headaches.  Trouble breathing.  A faster heart rate.  Stomach pain, nausea, or vomiting.  Diarrhea or constipation.  Trouble concentrating. Follow these instructions at home: Lifestyle  Identify the source of your stress and  your reaction to it. See a therapist who can help you change your reactions.  When there are stressful events: ? Talk about it with family, friends, or co-workers. ? Try to think realistically about stressful events and not ignore them or overreact. ? Try to find the positives in a stressful situation and not focus on the negatives. ? Cut back on responsibilities at work and home, if possible. Ask for help from friends or family members if you need it.  Find ways to cope with stress, such as: ? Meditation. ? Deep breathing. ? Yoga or tai chi. ? Progressive muscle relaxation. ?  Doing art, playing music, or reading. ? Making time for fun activities. ? Spending time with family and friends.  Get support from family, friends, or spiritual resources. Eating and drinking  Eat a healthy diet. This includes: ? Eating foods that are high in fiber, such as beans, whole grains, and fresh fruits and vegetables. ? Limiting foods that are high in fat and processed sugars, such as fried and sweet foods.  Do not skip meals or overeat.  Drink enough fluid to keep your urine pale yellow. Alcohol use  Do not drink alcohol if: ? Your health care provider tells you not to drink. ? You are pregnant, may be pregnant, or are planning to become pregnant.  Drinking alcohol is a way some people try to ease their stress. This can be dangerous, so if you drink alcohol: ? Limit how much you use to:  0-1 drink a day for women.  0-2 drinks a day for men. ? Be aware of how much alcohol is in your drink. In the [place] one drink equals one 12 oz bottle of beer (355 mL), one 5 oz glass of wine (148 mL), or one 1 oz glass of hard liquor (44 mL). Activity   Include 30 minutes of exercise in your daily schedule. Exercise is a good stress reducer.  Include time in your day for an activity that you find relaxing. Try taking a walk, going on a bike ride, reading a book, or listening to music.  Schedule your  time in a way that lowers stress, and keep a consistent schedule. Prioritize what is most important to get done. General instructions  Get enough sleep. Try to go to sleep and get up at about the same time every day.  Take over-the-counter and prescription medicines only as told by your health care provider.  Do not use any products that contain nicotine or tobacco, such as cigarettes, e-cigarettes, and chewing tobacco. If you need help quitting, ask your health care provider.  Do not use drugs or smoke to cope with stress.  Keep all follow-up visits as told by your health care provider. This is important. Where to find support  Talk with your health care provider about stress management or finding a support group.  Find a therapist to work with you on your stress management techniques. Contact a health care provider if:  Your stress symptoms get worse.  You are unable to manage your stress at home.  You are struggling to stop using drugs or alcohol. Get help right away if:  You may be a danger to yourself or others.  You have any thoughts of death or suicide. If you ever feel like you may hurt yourself or others, or have thoughts about taking your own life, get help right away. You can go to your nearest emergency department or call:  Your local emergency services (911 in the [place]  A suicide crisis helpline, such as the [place] at 865 726 7741. This is open 24 hours a day. Summary  Feeling a certain amount of stress is normal, but severe or long-lasting (chronic) stress can affect your mental and physical health.  Chronic stress can put you at higher risk for anxiety, depression, and other health problems like digestive problems, muscle aches, heart disease, high blood pressure, and stroke.  You may be at higher risk for stress-related problems if you do not get enough sleep, are in poor health, lack emotional support, or have a mental health  disorder  like anxiety or depression.  Identify the source of your stress and your reaction to it. Try talking about stressful events with family, friends, or co-workers, finding a coping method, or getting support from spiritual resources.  If you need more help, talk with your health care provider about finding a support group or a mental health therapist. This information is not intended to replace advice given to you by your health care provider. Make sure you discuss any questions you have with your health care provider. Document Revised: 05/27/2019 Document Reviewed: 05/27/2019 Elsevier Patient Education  [place].

## 2020-06-18 LAB — CBC
HCT: 41.1 % (ref 35.0–45.0)
Hemoglobin: 13.4 g/dL (ref 11.7–15.5)
MCH: 30.1 pg (ref 27.0–33.0)
MCHC: 32.6 g/dL (ref 32.0–36.0)
MCV: 92.4 fL (ref 80.0–100.0)
MPV: 11 fL (ref 7.5–12.5)
Platelets: 304 10*3/uL (ref 140–400)
RBC: 4.45 10*6/uL (ref 3.80–5.10)
RDW: 12.3 % (ref 11.0–15.0)
WBC: 8.1 10*3/uL (ref 3.8–10.8)

## 2020-06-18 LAB — COMPLETE METABOLIC PANEL WITH GFR
AG Ratio: 1.6 (calc) (ref 1.0–2.5)
ALT: 21 U/L (ref 6–29)
AST: 25 U/L (ref 10–35)
Albumin: 4.5 g/dL (ref 3.6–5.1)
Alkaline phosphatase (APISO): 69 U/L (ref 37–153)
BUN: 12 mg/dL (ref 7–25)
CO2: 25 mmol/L (ref 20–32)
Calcium: 9.9 mg/dL (ref 8.6–10.4)
Chloride: 104 mmol/L (ref 98–110)
Creat: 0.88 mg/dL (ref 0.50–0.99)
GFR, Est African American: 79 mL/min/{1.73_m2} (ref >=60)
GFR, Est Non African American: 68 mL/min/{1.73_m2} (ref >=60)
Globulin: 2.9 g/dL (calc) (ref 1.9–3.7)
Glucose, Bld: 87 mg/dL (ref 65–99)
Potassium: 4.4 mmol/L (ref 3.5–5.3)
Sodium: 141 mmol/L (ref 135–146)
Total Bilirubin: 1.4 mg/dL — ABNORMAL HIGH (ref 0.2–1.2)
Total Protein: 7.4 g/dL (ref 6.1–8.1)

## 2020-06-18 LAB — LIPID PANEL
Cholesterol: 170 mg/dL (ref <200)
HDL: 57 mg/dL (ref >=50)
LDL Cholesterol (Calc): 83 mg/dL (calc)
Non-HDL Cholesterol (Calc): 113 mg/dL (calc) (ref <130)
Total CHOL/HDL Ratio: 3 (calc) (ref <5.0)
Triglycerides: 204 mg/dL — ABNORMAL HIGH (ref <150)

## 2020-06-18 LAB — TSH: TSH: 0.74 mIU/L (ref 0.40–4.50)

## 2020-06-23 ENCOUNTER — Encounter: Payer: Self-pay | Admitting: Family Medicine

## 2020-06-27 ENCOUNTER — Other Ambulatory Visit: Payer: Self-pay | Admitting: Family Medicine

## 2020-07-28 ENCOUNTER — Other Ambulatory Visit: Payer: Self-pay | Admitting: Family Medicine

## 2020-08-06 IMAGING — DX LEFT HAND - COMPLETE 3+ VIEW
3 series · 3 of 3 positions shown · non-contrast
Comparison: None.

CLINICAL DATA: Joint pain.  Second DIP finger deformity.

EXAM:
LEFT HAND - COMPLETE 3+ VIEW

[hand pa]
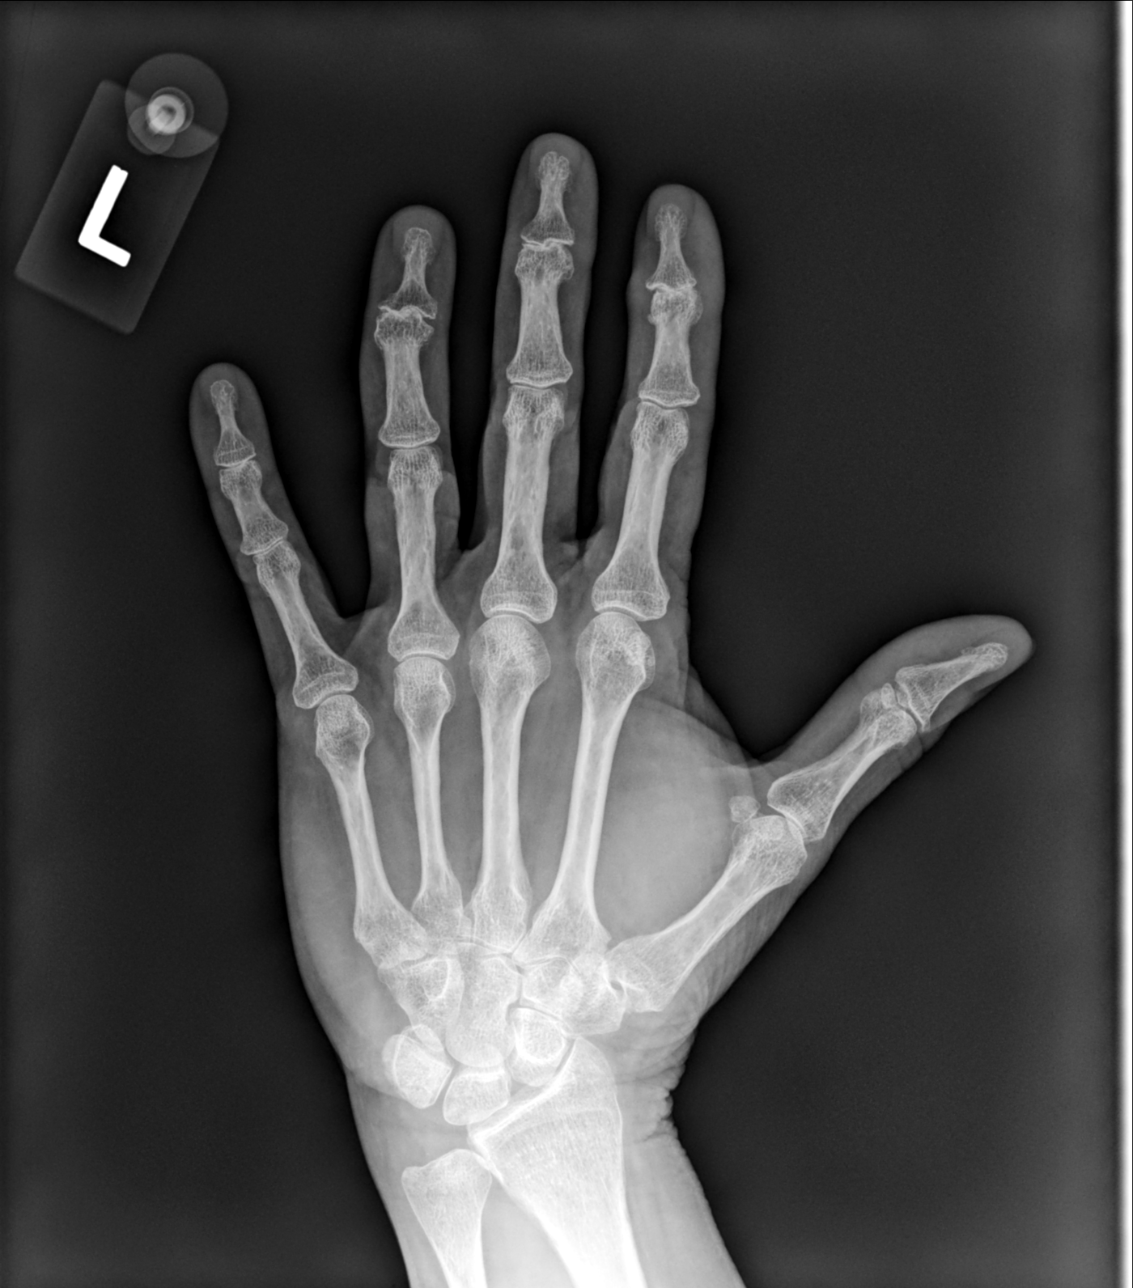

[hand mlo]
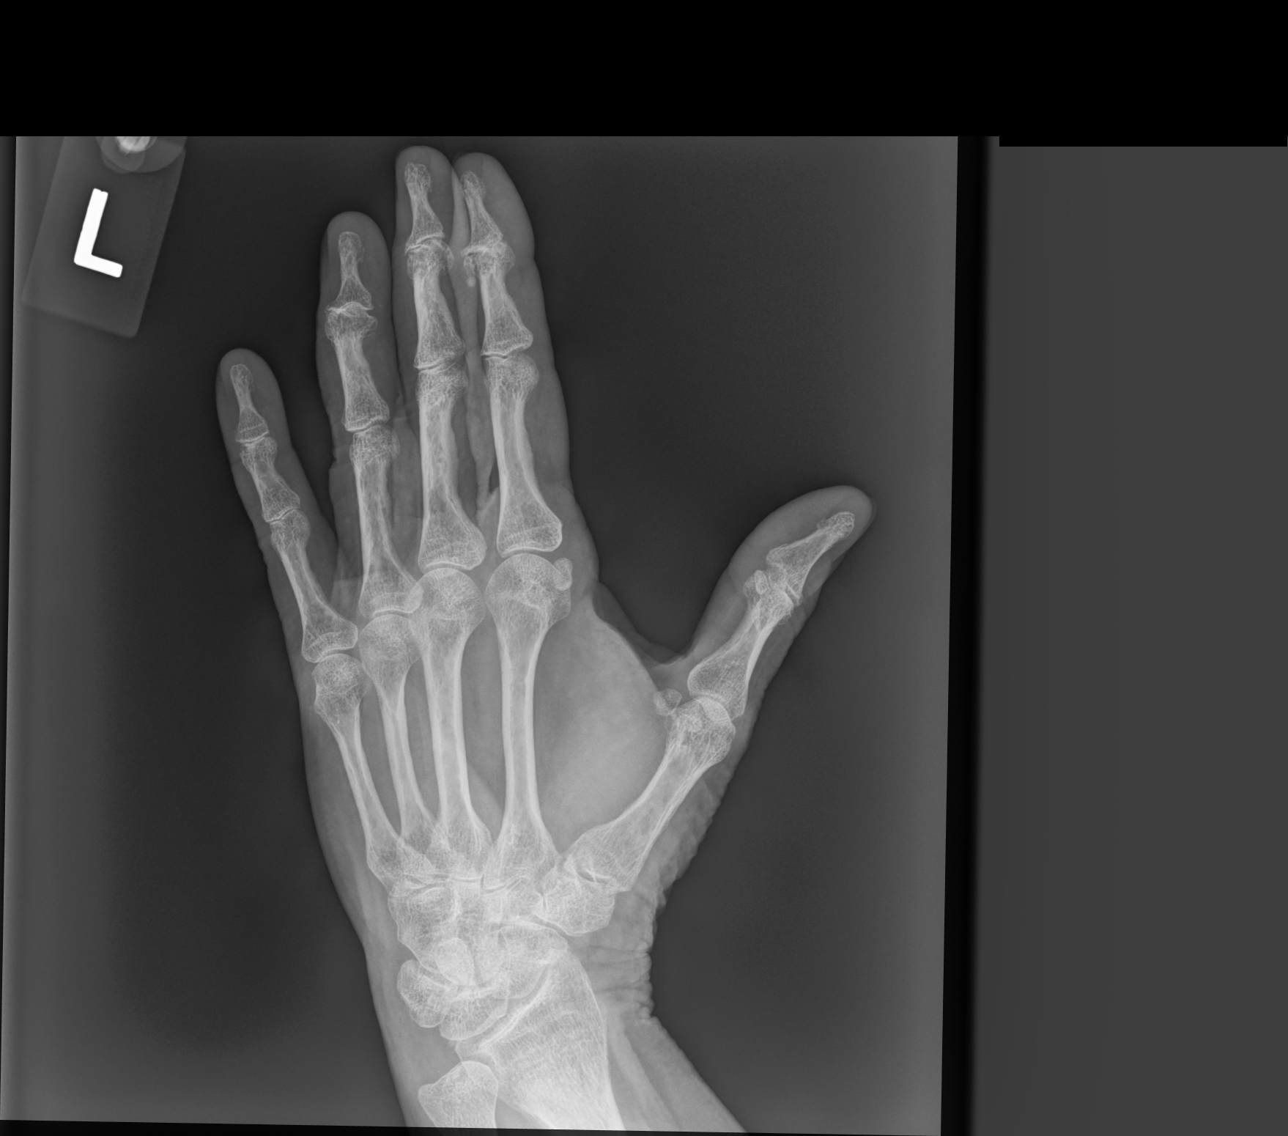

[hand lat]
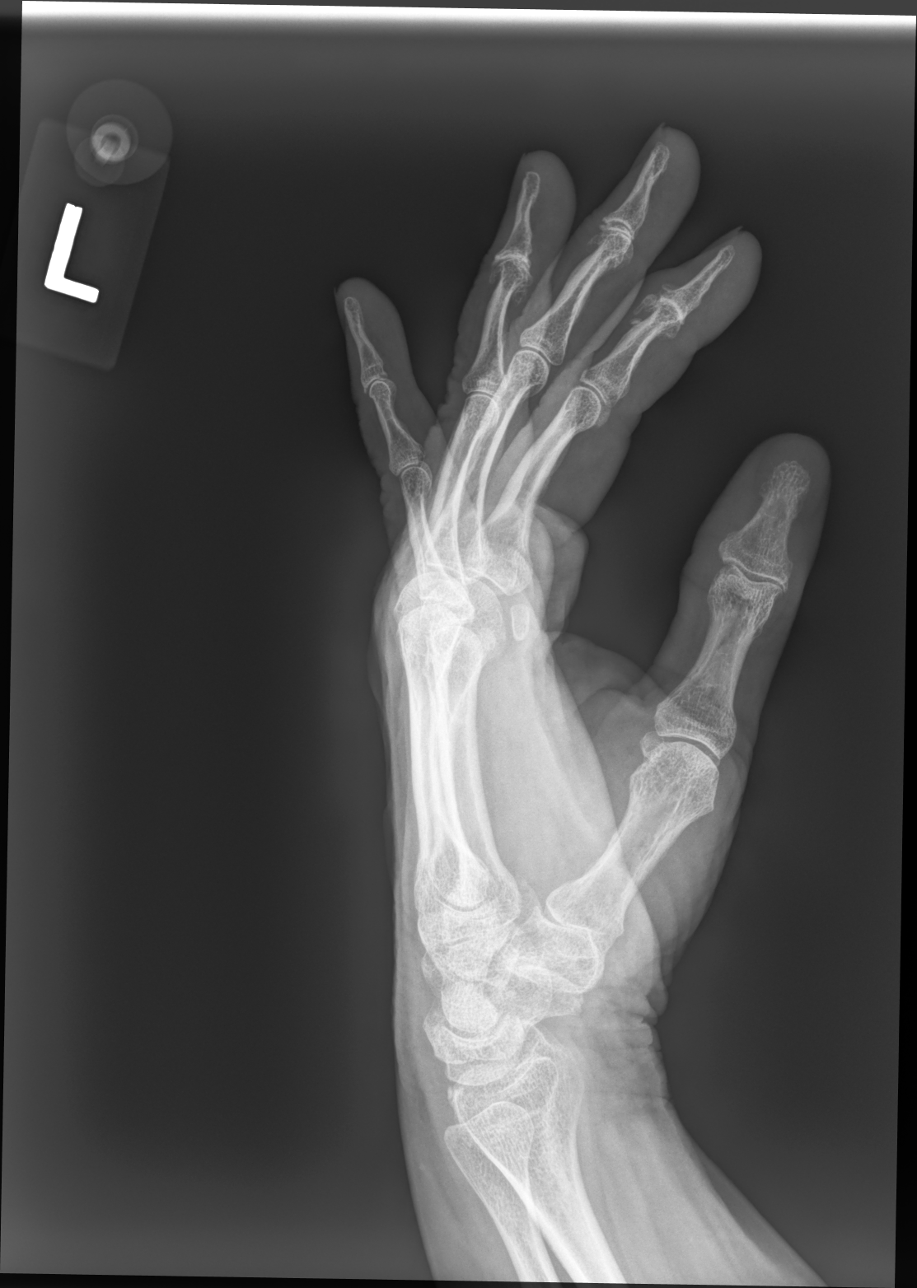

[3 of 3 positions shown; findings below may reference images not displayed]

FINDINGS: The mineralization is normal. There is no evidence of acute fracture
or dislocation. There are moderately advanced osteoarthritic changes
involving the distal interphalangeal joints, especially in the
index, long and ring fingers. The proximal interphalangeal and
metacarpal phalangeal joint spaces are largely preserved. There are
mild degenerative changes within the wrist. Mild soft tissue
thickening is present around the DIP joint of the index finger.
IMPRESSION: Moderately advanced osteoarthritis of the distal interphalangeal
joints as described.

## 2020-09-10 ENCOUNTER — Ambulatory Visit: Payer: Medicare Other | Attending: Internal Medicine

## 2020-09-10 DIAGNOSIS — Z23 Encounter for immunization: Secondary | ICD-10-CM

## 2020-09-10 NOTE — Progress Notes (Signed)
   [facility]  Name:  Natalie Moran    MRN: [id number] DOB: 1951-06-22  09/10/2020  Natalie Moran was observed post Covid-19 immunization for 15 minutes without incident. She was provided with Vaccine Information Sheet and instruction to access the V-Safe system.   Natalie Moran was instructed to call 911 with any severe reactions post vaccine: Marland Kitchen Difficulty breathing  . Swelling of face and throat  . A fast heartbeat  . A bad rash all over body  . Dizziness and weakness

## 2020-09-12 ENCOUNTER — Other Ambulatory Visit: Payer: Self-pay | Admitting: Family Medicine

## 2020-09-12 DIAGNOSIS — Z1231 Encounter for screening mammogram for malignant neoplasm of breast: Secondary | ICD-10-CM

## 2020-10-27 ENCOUNTER — Other Ambulatory Visit: Payer: Self-pay | Admitting: Family Medicine

## 2020-12-20 ENCOUNTER — Other Ambulatory Visit: Payer: Self-pay | Admitting: Family Medicine

## 2020-12-20 DIAGNOSIS — E2839 Other primary ovarian failure: Secondary | ICD-10-CM

## 2020-12-23 ENCOUNTER — Ambulatory Visit
Admission: RE | Admit: 2020-12-23 | Discharge: 2020-12-23 | Disposition: A | Discharge status: Home / Self Care | Payer: Medicare Other | Source: Ambulatory Visit | Attending: Family Medicine | Admitting: Family Medicine

## 2020-12-23 ENCOUNTER — Other Ambulatory Visit: Payer: Self-pay

## 2020-12-23 ENCOUNTER — Other Ambulatory Visit: Payer: Medicare Other

## 2020-12-23 DIAGNOSIS — E2839 Other primary ovarian failure: Secondary | ICD-10-CM

## 2020-12-23 DIAGNOSIS — Z1231 Encounter for screening mammogram for malignant neoplasm of breast: Secondary | ICD-10-CM

## 2020-12-28 LAB — HM MAMMOGRAPHY

## 2021-01-02 ENCOUNTER — Encounter: Payer: Self-pay | Admitting: Family Medicine

## 2021-01-30 ENCOUNTER — Other Ambulatory Visit: Payer: Self-pay | Admitting: Family Medicine

## 2021-05-25 ENCOUNTER — Telehealth (INDEPENDENT_AMBULATORY_CARE_PROVIDER_SITE_OTHER): Payer: Medicare Other | Admitting: Family Medicine

## 2021-05-25 ENCOUNTER — Encounter: Payer: Self-pay | Admitting: Family Medicine

## 2021-05-25 VITALS — Temp 100.3°F

## 2021-05-25 DIAGNOSIS — U071 COVID-19: Secondary | ICD-10-CM

## 2021-05-25 MED ORDER — MOLNUPIRAVIR EUA 200MG CAPSULE: 4.0000 | ORAL_CAPSULE | Freq: Two times a day (BID) | ORAL | 0 refills | Status: AC

## 2021-05-25 NOTE — Patient Instructions (Addendum)
HOME CARE TIPS:  -I sent the medication(s) we discussed to your pharmacy: Meds ordered this encounter  Medications   molnupiravir EUA 200 mg CAPS    Sig: Take 4 capsules (800 mg total) by mouth 2 (two) times daily for 5 days.    Dispense:  40 capsule    Refill:  0     -I sent in the Covid19 treatment or referral you requested per our discussion. Please see the information provided below and discuss further with the pharmacist/treatment team.   -can use tylenol if needed for fevers, aches and pains per instructions  -can use nasal saline a few times per day if you have nasal congestion; sometimes  a short course of Afrin nasal spray for 3 days can help with symptoms as well  -stay hydrated, drink plenty of fluids and eat small healthy meals - avoid dairy  -can take 1000 IU (25mcg) Vit D3 and 100-500 mg of Vit C daily per instructions  -If the Covid test is positive, check out the CDC website for more information on home care, transmission and treatment for COVID19  -follow up with your doctor in 2-3 days unless improving and feeling better  -stay home while sick, except to seek medical care. If you have COVID19, ideally it would be best to stay home for a full 10 days since the onset of symptoms PLUS one day of no fever and feeling better. Wear a good mask that fits snugly (such as N95 or KN95) if around others to reduce the risk of transmission.  It was nice to meet you today, and I really hope you are feeling better soon. I help [facility] out with telemedicine visits on Tuesdays and Thursdays and am available for visits on those days. If you have any concerns or questions following this visit please schedule a follow up visit with your Primary Care doctor or seek care at a local urgent care clinic to avoid delays in care.    Seek in person care or schedule a follow up video visit promptly if your symptoms worsen, new concerns arise or you are not improving with treatment. Call 911  and/or seek emergency care if your symptoms are severe or life threatening.    Fact Sheet for Patients And Caregivers Emergency Use Authorization (EUA) Of LAGEVRIOT (molnupiravir) capsules For Coronavirus Disease 2019 (COVID-19)  What is the most important information I should know about LAGEVRIO? LAGEVRIO may cause serious side effects, including: ? LAGEVRIO may cause harm to your unborn baby. It is not known if LAGEVRIO will harm your baby if you take LAGEVRIO during pregnancy. o LAGEVRIO is not recommended for use in pregnancy. o LAGEVRIO has not been studied in pregnancy. LAGEVRIO was studied in pregnant animals only. When LAGEVRIO was given to pregnant animals, LAGEVRIO caused harm to their unborn babies. o You and your healthcare provider may decide that you should take LAGEVRIO during pregnancy if there are no other COVID-19 treatment options approved or authorized by the FDA that are accessible or clinically appropriate for you. o If you and your healthcare provider decide that you should take LAGEVRIO during pregnancy, you and your healthcare provider should discuss the known and potential benefits and the potential risks of taking LAGEVRIO during pregnancy. For individuals who are able to become pregnant: ? You should use a reliable method of birth control (contraception) consistently and correctly during treatment with LAGEVRIO and for 4 days after the last dose of LAGEVRIO. Talk to your healthcare provider about   reliable birth control methods. ? Before starting treatment with LAGEVRIO your healthcare provider may do a pregnancy test to see if you are pregnant before starting treatment with LAGEVRIO. ? Tell your healthcare provider right away if you become pregnant or think you may be pregnant during treatment with LAGEVRIO. Pregnancy Surveillance Program: ? There is a pregnancy surveillance program for individuals who take LAGEVRIO during pregnancy. The purpose of this  program is to collect information about the health of you and your baby. Talk to your healthcare provider about how to take part in this program. ? If you take LAGEVRIO during pregnancy and you agree to participate in the pregnancy surveillance program and allow your healthcare provider to share your information with [employer], then your healthcare provider will report your use of LAGEVRIO during pregnancy to [employer] by calling [phone] or pregnancyreporting.msd.com. For individuals who are sexually active with partners who are able to become pregnant: ? It is not known if LAGEVRIO can affect sperm. While the risk is regarded as low, animal studies to fully assess the potential for LAGEVRIO to affect the babies of males treated with LAGEVRIO have not been completed. A reliable method of birth control (contraception) should be used consistently and correctly during treatment with LAGEVRIO and for at least 3 months after the last dose. The risk to sperm beyond 3 months is not known. Studies to understand the risk to sperm beyond 3 months are ongoing. Talk to your healthcare provider about reliable birth control methods. Talk to your healthcare provider if you have questions or concerns about how LAGEVRIO may affect sperm. You are being given this fact sheet because your healthcare provider believes it is necessary to provide you with LAGEVRIO for the treatment of adults with mild-to-moderate coronavirus disease 2019 (COVID-19) with positive results of direct SARS-CoV-2 viral testing, and who are at high risk for progression to severe COVID-19 including hospitalization or death, and for whom other COVID-19 treatment options approved or authorized by the FDA are not accessible or clinically appropriate. The [place]. Food and Drug Administration (FDA) has issued an Emergency Use Authorization (EUA) to make LAGEVRIO available during the COVID-19 pandemic (for more  details about an EUA please see "What is an Emergency Use Authorization?" at the end of this document). LAGEVRIO is not an FDA-approved medicine in the [place]. Read this Fact Sheet for information about LAGEVRIO. Talk to your healthcare provider about your options if you have any questions. It is your choice to take LAGEVRIO.  What is COVID-19? COVID-19 is caused by a virus called a coronavirus. You can get COVID-19 through close contact with another person who has the virus. COVID-19 illnesses have ranged from very mild-to-severe, including illness resulting in death. While information so far suggests that most COVID-19 illness is mild, serious illness can happen and may cause some of your other medical conditions to become worse. Older people and people of all ages with severe, long lasting (chronic) medical conditions like heart disease, lung disease and diabetes, for example seem to be at higher risk of being hospitalized for COVID-19.  What is LAGEVRIO? LAGEVRIO is an investigational medicine used to treat mild-to-moderate COVID-19 in adults: ? with positive results of direct SARS-CoV-2 viral testing, and ? who are at high risk for progression to severe COVID-19 including hospitalization or death, and for whom other COVID-19 treatment options approved or authorized by the FDA are not accessible or clinically appropriate. The FDA has authorized the   emergency use of LAGEVRIO for the treatment of mild-tomoderate COVID-19 in adults under an EUA. For more information on EUA, see the "What is an Emergency Use Authorization (EUA)?" section at the end of this Fact Sheet. LAGEVRIO is not authorized: ? for use in people less than 18 years of age. ? for prevention of COVID-19. ? for people needing hospitalization for COVID-19. ? for use for longer than 5 consecutive days.  What should I tell my healthcare provider before I take LAGEVRIO? Tell your healthcare provider if you: ? Have  any allergies ? Are breastfeeding or plan to breastfeed ? Have any serious illnesses ? Are taking any medicines (prescription, over-the-counter, vitamins, or herbal products).  How do I take LAGEVRIO? ? Take LAGEVRIO exactly as your healthcare provider tells you to take it. ? Take 4 capsules of LAGEVRIO every 12 hours (for example, at 8 am and at 8 pm) ? Take LAGEVRIO for 5 days. It is important that you complete the full 5 days of treatment with LAGEVRIO. Do not stop taking LAGEVRIO before you complete the full 5 days of treatment, even if you feel better. ? Take LAGEVRIO with or without food. ? You should stay in isolation for as long as your healthcare provider tells you to. Talk to your healthcare provider if you are not sure about how to properly isolate while you have COVID-19. ? Swallow LAGEVRIO capsules whole. Do not open, break, or crush the capsules. If you cannot swallow capsules whole, tell your healthcare provider. ? What to do if you miss a dose: o If it has been less than 10 hours since the missed dose, take it as soon as you remember o If it has been more than 10 hours since the missed dose, skip the missed dose and take your dose at the next scheduled time. ? Do not double the dose of LAGEVRIO to make up for a missed dose.  What are the important possible side effects of LAGEVRIO? ? See, "What is the most important information I should know about LAGEVRIO?" ? Allergic Reactions. Allergic reactions can happen in people taking LAGEVRIO, even after only 1 dose. Stop taking LAGEVRIO and call your healthcare provider right away if you get any of the following symptoms of an allergic reaction: o hives o rapid heartbeat o trouble swallowing or breathing o swelling of the mouth, lips, or face o throat tightness o hoarseness o skin rash The most common side effects of LAGEVRIO are: ? diarrhea ? nausea ? dizziness These are not all the possible side effects of LAGEVRIO.  Not many people have taken LAGEVRIO. Serious and unexpected side effects may happen. This medicine is still being studied, so it is possible that all of the risks are not known at this time.  What other treatment choices are there?  Veklury (remdesivir) is FDA-approved as an intravenous (IV) infusion for the treatment of mildto-moderate COVID-19 in certain adults and children. Talk with your doctor to see if Veklury is appropriate for you. Like LAGEVRIO, FDA may also allow for the emergency use of other medicines to treat people with COVID-19. Go to https://www.fda.gov/emergency-preparedness-and-response/mcm-legalregulatory-and-policy-framework/emergency-use-authorization for more information. It is your choice to be treated or not to be treated with LAGEVRIO. Should you decide not to take it, it will not change your standard medical care.  What if I am breastfeeding? Breastfeeding is not recommended during treatment with LAGEVRIO and for 4 days after the last dose of LAGEVRIO. If you are breastfeeding or plan to   breastfeed, talk to your healthcare provider about your options and specific situation before taking LAGEVRIO.  How do I report side effects with LAGEVRIO? Contact your healthcare provider if you have any side effects that bother you or do not go away. Report side effects to FDA MedWatch at www.fda.gov/medwatch or call 1-800-FDA-1088 (1- 800-332-1088).  How should I store LAGEVRIO? ? Store LAGEVRIO capsules at room temperature between 68F to 77F (20C to 25C). ? Keep LAGEVRIO and all medicines out of the reach of children and pets. How can I learn more about COVID-19? ? Ask your healthcare provider. ? Visit www.cdc.gov/COVID19 ? Contact your local or state public health department. ? Call [employer] at 1-800-672-6372 (toll free in the [place] ? Visit www.molnupiravir.com  What Is an Emergency Use Authorization (EUA)? The [place] FDA has made LAGEVRIO  available under an emergency access mechanism called an Emergency Use Authorization (EUA) The EUA is supported by a Secretary of Health and Human Service (HHS) declaration that circumstances exist to justify emergency use of drugs and biological products during the COVID-19 pandemic. LAGEVRIO for the treatment of mild-to-moderate COVID-19 in adults with positive results of direct SARS-CoV-2 viral testing, who are at high risk for progression to severe COVID-19, including hospitalization or death, and for whom alternative COVID-19 treatment options approved or authorized by FDA are not accessible or clinically appropriate, has not undergone the same type of review as an FDA-approved product. In issuing an EUA under the COVID-19 public health emergency, the FDA has determined, among other things, that based on the total amount of scientific evidence available including data from adequate and well-controlled clinical trials, if available, it is reasonable to believe that the product may be effective for diagnosing, treating, or preventing COVID-19, or a serious or life-threatening disease or condition caused by COVID-19; that the known and potential benefits of the product, when used to diagnose, treat, or prevent such disease or condition, outweigh the known and potential risks of such product; and that there are no adequate, approved, and available alternatives.  All of these criteria must be met to allow for the product to be used in the treatment of patients during the COVID-19 pandemic. The EUA for LAGEVRIO is in effect for the duration of the COVID-19 declaration justifying emergency use of LAGEVRIO, unless terminated or revoked (after which LAGEVRIO may no longer be used under the EUA). For patent information: www.msd.com/research/patent Copyright  2021-2022 [employer] [place], [place] and its affiliates. All rights reserved. [id number] Revised: March 2022  

## 2021-05-25 NOTE — Progress Notes (Signed)
Virtual Visit via Video Note  I connected with Debbie  on 05/25/21 at 12:20 PM EDT by a video enabled telemedicine application and verified that I am speaking with the correct person using two identifiers.  Location patient: home, [place] Location provider:work or home office Persons participating in the virtual visit: patient, provider  I discussed the limitations of evaluation and management by telemedicine and the availability of in person appointments. The patient expressed understanding and agreed to proceed.   HPI:  Acute telemedicine visit for Covid19: -Onset: 2 days ago, tested positive for covid today -Symptoms include: sore throat, HA, body aches, mild cough, low grade temps -denies any issues with her asthma - not requiring rescue inhaler -Denies: CP, SOB, NVD, inability to eat/drink/get out of bed -Has tried:tylenol and chloraseptic spray -Pertinent past medical history: see below -Pertinent medication allergies:  Allergies  Allergen Reactions   Codeine    Lisinopril     cough   Niacin And Related    Wixela Inhub [Fluticasone-Salmeterol]     Gets jittery  -COVID-19 vaccine status: had two doses and 1 booster of the pfizer -no recent labs  ROS: See pertinent positives and negatives per HPI.  Past Medical History:  Diagnosis Date   Asthma    Hypercholesterolemia    Hypertension     Past Surgical History:  Procedure Laterality Date   APPENDECTOMY     BREAST EXCISIONAL BIOPSY Left    BREAST SURGERY     CESAREAN SECTION     EYE SURGERY       Current Outpatient Medications:    albuterol (PROVENTIL HFA;VENTOLIN HFA) 108 (90 Base) MCG/ACT inhaler, Inhale 1 puff into the lungs as needed for wheezing or shortness of breath., Disp: , Rfl:    amLODipine (NORVASC) 5 MG tablet, TAKE 1 TABLET BY MOUTH EVERY DAY, Disp: 90 tablet, Rfl: 3   aspirin EC 81 MG tablet, Take 81 mg by mouth daily. Swallow whole., Disp: , Rfl:    CALCIUM PO, Take by mouth., Disp: , Rfl:     EPINEPHrine 0.3 mg/0.3 mL IJ SOAJ injection, Inject 0.3 mg into the muscle as needed for anaphylaxis., Disp: , Rfl:    estradiol (ESTRACE) 0.1 MG/GM vaginal cream, Place 1 Applicatorful vaginally every 7 (seven) days., Disp: , Rfl:    Fluticasone-Salmeterol (ADVAIR) 250-50 MCG/DOSE AEPB, Inhale 1 puff into the lungs 2 (two) times daily., Disp: , Rfl:    MAGNESIUM PO, Take by mouth., Disp: , Rfl:    molnupiravir EUA 200 mg CAPS, Take 4 capsules (800 mg total) by mouth 2 (two) times daily for 5 days., Disp: 40 capsule, Rfl: 0   simvastatin (ZOCOR) 20 MG tablet, TAKE 1 TABLET BY MOUTH EVERYDAY AT BEDTIME, Disp: 90 tablet, Rfl: 1   zafirlukast (ACCOLATE) 20 MG tablet, Take 20 mg by mouth daily., Disp: , Rfl:   EXAM:  VITALS per patient if applicable:  GENERAL: alert, oriented, appears well and in no acute distress  HEENT: atraumatic, conjunttiva clear, no obvious abnormalities on inspection of external nose and ears  NECK: normal movements of the head and neck  LUNGS: on inspection no signs of respiratory distress, breathing rate appears normal, no obvious gross SOB, gasping or wheezing  CV: no obvious cyanosis  MS: moves all visible extremities without noticeable abnormality  PSYCH/NEURO: pleasant and cooperative, no obvious depression or anxiety, speech and thought processing grossly intact  ASSESSMENT AND PLAN:  Discussed the following assessment and plan:  COVID-19   Discussed treatment options,  ideal treatment window, potential complications, isolation and precautions for COVID-19.  After lengthy discussion, the patient opted for treatment with molnupiravir due to being higher risk for complications of covid or severe disease and other factors. Discussed EUA status of this drug and the fact that there is preliminary limited knowledge of risks/interactions/side effects per EUA document vs possible benefits and precautions. This information was shared with patient during the visit and  also was provided in patient instructions. Also, advised that patient discuss risks/interactions and use with pharmacist/treatment team as well. The patient did want a prescription for cough, Tessalon Rx sent.  Other symptomatic care measures summarized in patient instructions. Work/School slipped offered:  declined Advised to seek prompt in person care if worsening, new symptoms arise, or if is not improving with treatment. Discussed options for inperson care if PCP office not available. Did let this patient know that I only do telemedicine on Tuesdays and Thursdays for [facility]. Advised to schedule follow up visit with PCP or [facility] if any further questions or concerns to avoid delays in care.   I discussed the assessment and treatment plan with the patient. The patient was provided an opportunity to ask questions and all were answered. The patient agreed with the plan and demonstrated an understanding of the instructions.     Terressa Koyanagi, DO

## 2021-06-19 ENCOUNTER — Ambulatory Visit: Payer: Medicare Other | Admitting: Family Medicine

## 2021-07-24 ENCOUNTER — Encounter: Payer: Self-pay | Admitting: Family Medicine

## 2021-07-24 ENCOUNTER — Other Ambulatory Visit: Payer: Self-pay

## 2021-07-24 ENCOUNTER — Ambulatory Visit (INDEPENDENT_AMBULATORY_CARE_PROVIDER_SITE_OTHER): Payer: Medicare Other | Admitting: Family Medicine

## 2021-07-24 VITALS — BP 142/100 | HR 77 | Temp 98.1°F | Ht 65.5 in | Wt 177.8 lb

## 2021-07-24 DIAGNOSIS — B351 Tinea unguium: Secondary | ICD-10-CM

## 2021-07-24 DIAGNOSIS — J4540 Moderate persistent asthma, uncomplicated: Secondary | ICD-10-CM

## 2021-07-24 DIAGNOSIS — Z1211 Encounter for screening for malignant neoplasm of colon: Secondary | ICD-10-CM

## 2021-07-24 DIAGNOSIS — E781 Pure hyperglyceridemia: Secondary | ICD-10-CM

## 2021-07-24 DIAGNOSIS — J3089 Other allergic rhinitis: Secondary | ICD-10-CM | POA: Diagnosis not present

## 2021-07-24 DIAGNOSIS — Z0001 Encounter for general adult medical examination with abnormal findings: Secondary | ICD-10-CM | POA: Diagnosis not present

## 2021-07-24 DIAGNOSIS — Z23 Encounter for immunization: Secondary | ICD-10-CM

## 2021-07-24 DIAGNOSIS — I10 Essential (primary) hypertension: Secondary | ICD-10-CM

## 2021-07-24 DIAGNOSIS — Z0000 Encounter for general adult medical examination without abnormal findings: Secondary | ICD-10-CM

## 2021-07-24 LAB — COMPREHENSIVE METABOLIC PANEL
ALT: 25 U/L (ref 0–35)
AST: 26 U/L (ref 0–37)
Albumin: 4.4 g/dL (ref 3.5–5.2)
Alkaline Phosphatase: 63 U/L (ref 39–117)
BUN: 14 mg/dL (ref 6–23)
CO2: 27 mEq/L (ref 19–32)
Calcium: 10.3 mg/dL (ref 8.4–10.5)
Chloride: 104 mEq/L (ref 96–112)
Creatinine, Ser: 0.84 mg/dL (ref 0.40–1.20)
GFR: 71.61 mL/min (ref >60.00)
Glucose, Bld: 94 mg/dL (ref 70–99)
Potassium: 4.2 mEq/L (ref 3.5–5.1)
Sodium: 141 mEq/L (ref 135–145)
Total Bilirubin: 1.5 mg/dL — ABNORMAL HIGH (ref 0.2–1.2)
Total Protein: 7.3 g/dL (ref 6.0–8.3)

## 2021-07-24 LAB — CBC WITH DIFFERENTIAL/PLATELET
Basophils Absolute: 0 10*3/uL (ref 0.0–0.1)
Basophils Relative: 0.4 % (ref 0.0–3.0)
Eosinophils Absolute: 0.1 10*3/uL (ref 0.0–0.7)
Eosinophils Relative: 1.1 % (ref 0.0–5.0)
HCT: 39.6 % (ref 36.0–46.0)
Hemoglobin: 13 g/dL (ref 12.0–15.0)
Lymphocytes Relative: 25.5 % (ref 12.0–46.0)
Lymphs Abs: 1.9 10*3/uL (ref 0.7–4.0)
MCHC: 32.9 g/dL (ref 30.0–36.0)
MCV: 90.5 fl (ref 78.0–100.0)
Monocytes Absolute: 0.6 10*3/uL (ref 0.1–1.0)
Monocytes Relative: 8.3 % (ref 3.0–12.0)
Neutro Abs: 4.8 10*3/uL (ref 1.4–7.7)
Neutrophils Relative %: 64.7 % (ref 43.0–77.0)
Platelets: 282 10*3/uL (ref 150.0–400.0)
RBC: 4.37 Mil/uL (ref 3.87–5.11)
RDW: 13.5 % (ref 11.5–15.5)
WBC: 7.4 10*3/uL (ref 4.0–10.5)

## 2021-07-24 LAB — T4, FREE: Free T4: 0.82 ng/dL (ref 0.60–1.60)

## 2021-07-24 LAB — TSH: TSH: 0.74 u[IU]/mL (ref 0.35–5.50)

## 2021-07-24 LAB — LIPID PANEL
Cholesterol: 168 mg/dL (ref 0–200)
HDL: 55 mg/dL (ref >39.00)
LDL Cholesterol: 82 mg/dL (ref 0–99)
NonHDL: 112.57
Total CHOL/HDL Ratio: 3
Triglycerides: 155 mg/dL — ABNORMAL HIGH (ref 0.0–149.0)
VLDL: 31 mg/dL (ref 0.0–40.0)

## 2021-07-24 LAB — HEMOGLOBIN A1C: Hgb A1c MFr Bld: 6 % (ref 4.6–6.5)

## 2021-07-24 MED ORDER — METOPROLOL TARTRATE 25 MG PO TABS: 12.5000 mg | ORAL_TABLET | Freq: Two times a day (BID) | ORAL | 3 refills | Status: DC

## 2021-07-27 NOTE — Progress Notes (Signed)
Subjective:   Natalie Moran is a 70 y.o. female who presents for Medicare Annual (Subsequent) preventive examination.  Pt endorses increased stress since last visit.  Pt's sister died last week.  Given the strained relationship between pt and her brother in law, she was not  allowed to see her sister as often as she would have liked.  Pt seen by Allergy for allergy shots.  Never received cologuard when previously ordered.  Pt noticed discoloration and mild thickening of b/l great toes.  Pt walking 3 times per week and mowing the grass for exercise  Review of Systems    +discoloration of b/l great toes, increased stress     Objective:    Today's Vitals   07/24/21 0932  BP: (!) 142/100  Pulse: 77  Temp: 98.1 F (36.7 C)  TempSrc: Oral  SpO2: 96%  Weight: 177 lb 12.8 oz (80.6 kg)  Height: 5' 5.5" (1.664 m)   Body mass index is 29.14 kg/m. Gen. Pleasant, well developed, well-nourished, in NAD HEENT - [place]/AT, PERRL, EOMI, conjunctive clear, no scleral icterus, no nasal drainage, pharynx without erythema or exudate.  TMs normal bilaterally. Neck: No JVD, no thyromegaly, no carotid bruits Lungs: no use of accessory muscles, CTAB, no wheezes, rales or rhonchi Cardiovascular: RRR,  No r/g/m, no peripheral edema Abdomen: BS present, soft, nontender,nondistended, no hepatosplenomegaly Musculoskeletal: No deformities, moves all four extremities, no cyanosis or clubbing, normal tone Neuro:  A&Ox3, CN II-XII intact, normal gait Skin:  Warm, dry, intact, no lesions.  Mild hypertrophy and white discoloration of bilateral great toenails. Psych: normal affect, mood appropriate  No flowsheet data found.  Current Medications (verified) Outpatient Encounter Medications as of 07/24/2021  Medication Sig   albuterol (PROVENTIL HFA;VENTOLIN HFA) 108 (90 Base) MCG/ACT inhaler Inhale 1 puff into the lungs as needed for wheezing or shortness of breath.   amLODipine (NORVASC) 5 MG tablet TAKE 1  TABLET BY MOUTH EVERY DAY   aspirin EC 81 MG tablet Take 81 mg by mouth daily. Swallow whole.   CALCIUM PO Take by mouth.   EPINEPHrine 0.3 mg/0.3 mL IJ SOAJ injection Inject 0.3 mg into the muscle as needed for anaphylaxis.   estradiol (ESTRACE) 0.1 MG/GM vaginal cream Place 1 Applicatorful vaginally every 7 (seven) days.   Fluticasone-Salmeterol (ADVAIR) 250-50 MCG/DOSE AEPB Inhale 1 puff into the lungs 2 (two) times daily.   MAGNESIUM PO Take by mouth.   metoprolol tartrate (LOPRESSOR) 25 MG tablet Take 0.5 tablets (12.5 mg total) by mouth 2 (two) times daily.   simvastatin (ZOCOR) 20 MG tablet TAKE 1 TABLET BY MOUTH EVERYDAY AT BEDTIME   zafirlukast (ACCOLATE) 20 MG tablet Take 20 mg by mouth daily.   No facility-administered encounter medications on file as of 07/24/2021.    Allergies (verified) Codeine, Lisinopril, Niacin and related, and Wixela inhub [fluticasone-salmeterol]   History: Past Medical History:  Diagnosis Date   Asthma    Hypercholesterolemia    Hypertension    Past Surgical History:  Procedure Laterality Date   APPENDECTOMY     BREAST EXCISIONAL BIOPSY Left    BREAST SURGERY     CESAREAN SECTION     EYE SURGERY     History reviewed. No pertinent family history. Social History   Socioeconomic History   Marital status: Married    Spouse name: Not on file   Number of children: Not on file   Years of education: Not on file   Highest education level: Not on file  Occupational History   Not on file  Tobacco Use   Smoking status: Never   Smokeless tobacco: Never  Substance and Sexual Activity   Alcohol use: No   Drug use: Never   Sexual activity: Not on file  Other Topics Concern   Not on file  Social History Narrative   Not on file   Social Determinants of Health   Financial Resource Strain: Not on file  Food Insecurity: Not on file  Transportation Needs: Not on file  Physical Activity: Not on file  Stress: Not on file  Social Connections:  Not on file    Tobacco Counseling Counseling given: Not Answered  Activities of Daily Living No flowsheet data found.  Patient Care Team: Deeann Saint, MD as PCP - General (Family Medicine)  Indicate any recent Medical Services you may have received from other than [facility] providers in the past year (date may be approximate).     Assessment:   This is a routine wellness examination for Natalie Moran.  Hearing/Vision screen Hearing Screening   500Hz  1000Hz  2000Hz  4000Hz   Right ear Fail Pass Pass Pass  Left ear Pass Pass Fail Fail    Dietary issues and exercise activities discussed:     Goals Addressed   None    Depression Screen PHQ 2/9 Scores 07/24/2021 06/17/2020 06/18/2019  PHQ - 2 Score 1 0 0  PHQ- 9 Score 2 - -    Fall Risk Fall Risk  07/24/2021 06/17/2020  Falls in the past year? 0 0    FALL RISK PREVENTION PERTAINING TO THE HOME:  Any stairs in or around the home? Yes  If so, are there any without handrails? Yes  Home free of loose throw rugs in walkways, pet beds, electrical cords, etc? Yes  Adequate lighting in your home to reduce risk of falls? Yes   ASSISTIVE DEVICES UTILIZED TO PREVENT FALLS:  Life alert? No  Use of a cane, walker or w/c? No  Grab bars in the bathroom? No  Shower chair or bench in shower? No  Elevated toilet seat or a handicapped toilet? No   Gait steady and fast without use of assistive device  Cognitive Function:        Immunizations Immunization History  Administered Date(s) Administered   Fluad Quad(high Dose 65+) 07/24/2021   Influenza, High Dose Seasonal PF 08/03/2018   PFIZER(Purple Top)SARS-COV-2 Vaccination 12/01/2019, 12/21/2019, 09/10/2020   Pneumococcal Polysaccharide-23 06/17/2019   Tdap 06/17/2019    TDAP status: Up to date  Flu Vaccine status: Completed at today's visit  Pneumococcal vaccine status: Up to date  Covid-19 vaccine status: Completed vaccines  Qualifies for Shingles Vaccine? Yes   Zostavax  completed No   Shingrix Completed?: No.    Education has been provided regarding the importance of this vaccine. Patient has been advised to call insurance company to determine out of pocket expense if they have not yet received this vaccine. Advised may also receive vaccine at local pharmacy or Health Dept. Verbalized acceptance and understanding.  Screening Tests Health Maintenance  Topic Date Due   Hepatitis C Screening  Never done   COLONOSCOPY (Pts 45-30yrs Insurance coverage will need to be confirmed)  Never done   Zoster Vaccines- Shingrix (1 of 2) Never done   PNA vac Low Risk Adult (2 of 2 - PCV13) 09/06/2020   COVID-19 Vaccine (4 - Booster for Pfizer series) 01/09/2021   MAMMOGRAM  12/28/2022   TETANUS/TDAP  06/16/2029   INFLUENZA VACCINE  Completed  DEXA SCAN  Completed   HPV VACCINES  Aged Out    Health Maintenance  Health Maintenance Due  Topic Date Due   Hepatitis C Screening  Never done   COLONOSCOPY (Pts 45-56yrs Insurance coverage will need to be confirmed)  Never done   Zoster Vaccines- Shingrix (1 of 2) Never done   PNA vac Low Risk Adult (2 of 2 - PCV13) 09/06/2020   COVID-19 Vaccine (4 - Booster for Pfizer series) 01/09/2021   Colorectal cancer screening: Type of screening: Cologuard. Ordered this visit.  Mammogram status: Completed 12/28/20. Repeat every year  Bone Density status: Completed 12/23/20. Results reflect: Bone density results: OSTEOPOROSIS. Repeat every 2 years.  Lung Cancer Screening: (Low Dose CT Chest recommended if Age 51-80 years, 30 pack-year currently smoking OR have quit w/in 15years.) does not qualify.   Lung Cancer Screening Referral: n/a  Additional Screening:  Hepatitis C Screening: does qualify  Vision Screening: Recommended annual ophthalmology exams for early detection of glaucoma and other disorders of the eye. Is the patient up to date with their annual eye exam?  Yes  Who is the provider or what is the name of the office  in which the patient attends annual eye exams?  Dr. Nile Riggs last eye exam 12/15/2020 If pt is not established with a provider, would they like to be referred to a provider to establish care?  N/a .   Dental Screening: Recommended annual dental exams for proper oral hygiene  Community Resource Referral / Chronic Care Management: CRR required this visit?  No   CCM required this visit?  No      Plan:    Essential hypertension  -Uncontrolled -Discussed lifestyle modifications -Patient encouraged to recheck BP at home and keep a log to bring with him to clinic -We will start metoprolol tartrate 12.5 mg twice daily -Continue Norvasc 5 mg daily -Follow-up in 4-6 weeks - Plan: metoprolol tartrate (LOPRESSOR) 25 MG tablet, CMP  Colon cancer screening  - Plan: Cologuard  Onychomycosis -Discussed prevention -Discussed OTC antifungal medications likely to take several months for results -Given handout -For continued or worsening symptoms consider p.o. antifungals and referral to podiatry  - Plan: CMP  Pure hypertriglyceridemia  -Lifestyle modifications -Continue Zocor 20 mg daily - Plan: Hemoglobin A1c, Lipid panel  Moderate persistent asthma without complication  -Stable -Continue follow-up with allergy and asthma -Continue current medications - Plan: CBC with Differential/Platelet, TSH, T4, Free  Environmental and seasonal allergies -Continue follow-up with allergy and asthma  - Plan: CBC with Differential/Platelet  Need for influenza vaccination  - Plan: Flu Vaccine QUAD High Dose(Fluad)  Medicare annual wellness visit, subsequent  I have personally reviewed and noted the following in the patient's chart:   Medical and social history Use of alcohol, tobacco or illicit drugs  Current medications and supplements including opioid prescriptions.  Functional ability and status Nutritional status Physical activity Advanced directives List of other  physicians Hospitalizations, surgeries, and ER visits in previous 12 months Vitals Screenings to include cognitive, depression, and falls Referrals and appointments  In addition, I have reviewed and discussed with patient certain preventive protocols, quality metrics, and best practice recommendations. A written personalized care plan for preventive services as well as general preventive health recommendations were provided to patient.    Deeann Saint, MD   07/27/2021

## 2021-07-28 ENCOUNTER — Telehealth: Payer: Self-pay | Admitting: Family Medicine

## 2021-07-28 NOTE — Telephone Encounter (Signed)
PT called in regards to the metoprolol tartrate (LOPRESSOR) 25 MG tablet. She saw where on the After visit summary from her last visit it still listed her 12.5mg  tablets. She would like to get clarification on what she is suppose to be taking the 12.5mg  or the 25mg  or both. Please advise.

## 2021-08-01 ENCOUNTER — Encounter: Payer: Self-pay | Admitting: Family Medicine

## 2021-08-03 ENCOUNTER — Other Ambulatory Visit: Payer: Self-pay | Admitting: Family Medicine

## 2021-08-06 LAB — COLOGUARD: Cologuard: NEGATIVE

## 2021-08-17 ENCOUNTER — Telehealth (INDEPENDENT_AMBULATORY_CARE_PROVIDER_SITE_OTHER): Payer: Medicare Other | Admitting: Family Medicine

## 2021-08-17 ENCOUNTER — Encounter: Payer: Self-pay | Admitting: Family Medicine

## 2021-08-17 VITALS — BP 127/77 | Temp 97.7°F

## 2021-08-17 DIAGNOSIS — R0981 Nasal congestion: Secondary | ICD-10-CM | POA: Diagnosis not present

## 2021-08-17 MED ORDER — BENZONATATE 200 MG PO CAPS: 200.0000 mg | ORAL_CAPSULE | Freq: Two times a day (BID) | ORAL | 0 refills | Status: DC | PRN

## 2021-08-17 NOTE — Patient Instructions (Addendum)
  HOME CARE TIPS:  -COVID19 testing information: [email or web address]  Most pharmacies also offer testing and home test kits.  -I sent the medication(s) we discussed to your pharmacy: Meds ordered this encounter  Medications   benzonatate (TESSALON) 200 MG capsule    Sig: Take 1 capsule (200 mg total) by mouth 2 (two) times daily as needed for cough.    Dispense:  20 capsule    Refill:  0    -use your albuterol every 4-6 hours AS NEEDED   -can use tylenol  if needed for fevers, aches and pains per instructions  -can use nasal saline a few times per day if you have nasal congestion  -stay hydrated, drink plenty of fluids and eat small healthy meals - avoid dairy  -can take 1000 IU ( ) Vit D3 and 100-500 mg of Vit C daily per instructions  -If the Covid test is positive, check out the [facility] website for more information on home care, transmission and treatment for COVID19  -follow up with your doctor in 2-3 days unless improving and feeling better  It was nice to meet you today, and I really hope you are feeling better soon. I help [place] out with telemedicine visits on Tuesdays and Thursdays and am available for visits on those days. If you have any concerns or questions following this visit please schedule a follow up visit with your Primary Care doctor or seek care at a local urgent care clinic to avoid delays in care.    Seek in person care or schedule a follow up video visit promptly if your symptoms worsen, new concerns arise or you are not improving with treatment. Call 911 and/or seek emergency care if your symptoms are severe or life threatening.

## 2021-08-17 NOTE — Progress Notes (Signed)
Virtual Visit via Video Note  I connected with Natalie Moran  on 08/17/21 at 10:00 AM EDT by a video enabled telemedicine application and verified that I am speaking with the correct person using two identifiers.  Location patient: home, [place] Location provider:work or home office Persons participating in the virtual visit: patient, provider  I discussed the limitations of evaluation and management by telemedicine and the availability of in person appointments. The patient expressed understanding and agreed to proceed.   HPI:  Acute telemedicine visit for Cough: -Onset: 6 days ago -Symptoms include: cough, nasal sinus congestion, she had a low grade fever and sore throat initially -Denies:CP, SOB, NVD, body aches, inability to eat/drink/get out of bed -husband had similar symptoms prior to her getting sick -Has tried:robitussin, dayquil, Nyquil -Pertinent past medical history: see below - denies any asthma symptoms and reports has albuterol to use if needed -Pertinent medication allergies: Allergies  Allergen Reactions   Codeine    Lisinopril     cough   Niacin And Related    Wixela Inhub [Fluticasone-Salmeterol]     Gets jittery  -COVID-19 vaccine status: vaccinated x 2 and had one booster -1x covid testing at home was negative  ROS: See pertinent positives and negatives per HPI.  Past Medical History:  Diagnosis Date   Asthma    Hypercholesterolemia    Hypertension     Past Surgical History:  Procedure Laterality Date   APPENDECTOMY     BREAST EXCISIONAL BIOPSY Left    BREAST SURGERY     CESAREAN SECTION     EYE SURGERY       Current Outpatient Medications:    albuterol (PROVENTIL HFA;VENTOLIN HFA) 108 (90 Base) MCG/ACT inhaler, Inhale 1 puff into the lungs as needed for wheezing or shortness of breath., Disp: , Rfl:    amLODipine (NORVASC) 5 MG tablet, TAKE 1 TABLET BY MOUTH EVERY DAY, Disp: 90 tablet, Rfl: 3   aspirin EC 81 MG tablet, Take 81 mg by mouth daily. Swallow  whole., Disp: , Rfl:    benzonatate (TESSALON) 200 MG capsule, Take 1 capsule (200 mg total) by mouth 2 (two) times daily as needed for cough., Disp: 20 capsule, Rfl: 0   CALCIUM PO, Take by mouth., Disp: , Rfl:    EPINEPHrine 0.3 mg/0.3 mL IJ SOAJ injection, Inject 0.3 mg into the muscle as needed for anaphylaxis., Disp: , Rfl:    estradiol (ESTRACE) 0.1 MG/GM vaginal cream, Place 1 Applicatorful vaginally every 7 (seven) days., Disp: , Rfl:    Fluticasone-Salmeterol (ADVAIR) 250-50 MCG/DOSE AEPB, Inhale 1 puff into the lungs 2 (two) times daily., Disp: , Rfl:    MAGNESIUM PO, Take by mouth., Disp: , Rfl:    metoprolol tartrate (LOPRESSOR) 25 MG tablet, Take 0.5 tablets (12.5 mg total) by mouth 2 (two) times daily., Disp: 30 tablet, Rfl: 3   simvastatin (ZOCOR) 20 MG tablet, TAKE 1 TABLET BY MOUTH EVERYDAY AT BEDTIME, Disp: 90 tablet, Rfl: 1   zafirlukast (ACCOLATE) 20 MG tablet, Take 20 mg by mouth daily., Disp: , Rfl:   EXAM:  VITALS per patient if applicable:  GENERAL: alert, oriented, appears well and in no acute distress  HEENT: atraumatic, conjunttiva clear, no obvious abnormalities on inspection of external nose and ears  NECK: normal movements of the head and neck  LUNGS: on inspection no signs of respiratory distress, breathing rate appears normal, no obvious gross SOB, gasping or wheezing  CV: no obvious cyanosis  MS: moves all visible extremities  without noticeable abnormality  PSYCH/NEURO: pleasant and cooperative, no obvious depression or anxiety, speech and thought processing grossly intact  ASSESSMENT AND PLAN:  Discussed the following assessment and plan:  Nasal congestion  -we discussed possible serious and likely etiologies, options for evaluation and workup, limitations of telemedicine visit vs in person visit, treatment, treatment risks and precautions. Pt is agreeable to treatment via telemedicine at this moment. Suspect VURI vs other. Discussed possibility to  false negative home testing for covid as well. She opted for repeat covid testing, Tessalon rx for cough, nasal saline and prn alb. Work/School slipped offered: declined Advised to seek prompt in person care if worsening, new symptoms arise, or if is not improving with treatment. Discussed options for inperson care if PCP office not available. Did let this patient know that I only do telemedicine on Tuesdays and Thursdays for [place]. Advised to schedule follow up visit with PCP or [facility] if any further questions or concerns to avoid delays in care.   I discussed the assessment and treatment plan with the patient. The patient was provided an opportunity to ask questions and all were answered. The patient agreed with the plan and demonstrated an understanding of the instructions.     Terressa Koyanagi, DO

## 2021-08-18 ENCOUNTER — Telehealth: Payer: Medicare Other | Admitting: Family Medicine

## 2021-08-21 ENCOUNTER — Telehealth: Payer: Self-pay

## 2021-08-21 NOTE — Telephone Encounter (Signed)
Patient called stating that she is not feeling any better after virtual visit and would like an Rx for sinus and congestion. Pt would like a call back if Rx can be sent.

## 2021-08-23 ENCOUNTER — Encounter (HOSPITAL_COMMUNITY): Payer: Self-pay | Admitting: Emergency Medicine

## 2021-08-23 ENCOUNTER — Other Ambulatory Visit: Payer: Self-pay

## 2021-08-23 ENCOUNTER — Ambulatory Visit (HOSPITAL_COMMUNITY)
Admission: EM | Admit: 2021-08-23 | Discharge: 2021-08-23 | Disposition: A | Discharge status: Home / Self Care | Payer: Medicare Other | Attending: Family Medicine | Admitting: Family Medicine

## 2021-08-23 ENCOUNTER — Ambulatory Visit: Payer: Medicare Other | Admitting: Family Medicine

## 2021-08-23 DIAGNOSIS — J0100 Acute maxillary sinusitis, unspecified: Secondary | ICD-10-CM | POA: Diagnosis not present

## 2021-08-23 MED ORDER — AMOXICILLIN 875 MG PO TABS: 875.0000 mg | ORAL_TABLET | Freq: Two times a day (BID) | ORAL | 0 refills | Status: AC

## 2021-08-23 NOTE — ED Provider Notes (Signed)
  [facility]   [id number] 08/23/21 Arrival Time: 1509  ASSESSMENT & PLAN:  1. Acute non-recurrent maxillary sinusitis    Begin: Meds ordered this encounter  Medications   amoxicillin (AMOXIL) 875 MG tablet    Sig: Take 1 tablet (875 mg total) by mouth 2 (two) times daily for 10 days.    Dispense:  20 tablet    Refill:  0    Discussed typical duration of symptoms. OTC symptom care as needed. Ensure adequate fluid intake and rest.   Follow-up Information     Deeann Saint, MD.   Specialty: Family Medicine Why: As needed. Contact information: [address] [place] [address] [phone]                 Reviewed expectations re: course of current medical issues. Questions answered. Outlined signs and symptoms indicating need for more acute intervention. Patient verbalized understanding. After Visit Summary given.   SUBJECTIVE: History from: patient.  Natalie Moran is a 70 y.o. female who presents with complaint of nasal congestion, post-nasal drainage, and sinus pain. Pain mostly over L face. Thick nasal drainage. Onset gradual,  past few weeks . Respiratory symptoms: initial cough; better. Fever: absent. Overall normal PO intake without n/v. OTC treatment: Sudafed; mild help. Seasonal allergies: no. History of frequent sinus infections: no. No specific aggravating or alleviating factors reported. Social History   Tobacco Use  Smoking Status Never  Smokeless Tobacco Never     OBJECTIVE:  Vitals:   08/23/21 1539  BP: (!) 156/83  Pulse: 78  Resp: 20  Temp: 97.8 F (36.6 C)  TempSrc: Oral  SpO2: 96%     General appearance: alert; no distress HEENT: nasal congestion; clear runny nose; throat irritation secondary to post-nasal drainage; bilateral maxillary tenderness to palpation; turbinates boggy Neck: supple without LAD; trachea midline Lungs: unlabored respirations, symmetrical air entry; cough: absent; no  respiratory distress Skin: warm and dry Psychological: alert and cooperative; normal mood and affect  Allergies  Allergen Reactions   Codeine    Lisinopril     cough   Niacin And Related    Wixela Inhub [Fluticasone-Salmeterol]     Gets jittery    Past Medical History:  Diagnosis Date   Asthma    Hypercholesterolemia    Hypertension    No family history on file. Social History   Socioeconomic History   Marital status: Married    Spouse name: Not on file   Number of children: Not on file   Years of education: Not on file   Highest education level: Not on file  Occupational History   Not on file  Tobacco Use   Smoking status: Never   Smokeless tobacco: Never  Vaping Use   Vaping Use: Never used  Substance and Sexual Activity   Alcohol use: No   Drug use: Never   Sexual activity: Not on file  Other Topics Concern   Not on file  Social History Narrative   Not on file   Social Determinants of Health   Financial Resource Strain: Not on file  Food Insecurity: Not on file  Transportation Needs: Not on file  Physical Activity: Not on file  Stress: Not on file  Social Connections: Not on file  Intimate Partner Violence: Not on file             Mardella Layman, MD 08/23/21 [phone]

## 2021-08-23 NOTE — ED Triage Notes (Signed)
August 11, 2021 started feeling bad.  Initially was sneezing and coughing, chest congestion, frequent need to blow nose.  Patient did have a virtual appt with [place].  Did not provide antibiotics.  Patient reports blowing green from left side of nose, left ear is stuffy and left side of face is very painful

## 2021-08-23 NOTE — ED Triage Notes (Signed)
Has tried robitussin, nyquil, dayquil and most recently sudafed sinus

## 2021-08-29 NOTE — Telephone Encounter (Signed)
Seen at urgent care 08/23/21.

## 2021-09-07 ENCOUNTER — Other Ambulatory Visit: Payer: Self-pay

## 2021-09-08 ENCOUNTER — Ambulatory Visit (INDEPENDENT_AMBULATORY_CARE_PROVIDER_SITE_OTHER): Payer: Medicare Other | Admitting: Family Medicine

## 2021-09-08 VITALS — BP 138/72 | HR 74 | Temp 98.3°F | Wt 176.4 lb

## 2021-09-08 DIAGNOSIS — J329 Chronic sinusitis, unspecified: Secondary | ICD-10-CM | POA: Diagnosis not present

## 2021-09-08 DIAGNOSIS — I10 Essential (primary) hypertension: Secondary | ICD-10-CM | POA: Diagnosis not present

## 2021-09-08 MED ORDER — METOPROLOL TARTRATE 25 MG PO TABS: 25.0000 mg | ORAL_TABLET | Freq: Two times a day (BID) | ORAL | 3 refills | Status: DC

## 2021-09-08 NOTE — Progress Notes (Signed)
Subjective:    Patient ID: Natalie Moran, female    DOB: April 14, 1951, 70 y.o.   MRN: [id number]  Chief Complaint  Patient presents with   Follow-up    BP and med    HPI Patient was seen today for f/u on HTN.  Taking norvasc 5 mg and metoprolol tartrate 12.5 mg BID.  BP at home 134/81, 133/82, 129/76, 112/71, 127/77, 132/69, 126/66.  Patient notes she was recently on amoxicillin and prednisone as prescribed by UC for sinusitis.  Patient was also taking Sudafed/other OTC cough/cold medications.  Denies changes in vision, CP, headache.  Past Medical History:  Diagnosis Date   Asthma    Hypercholesterolemia    Hypertension     Allergies  Allergen Reactions   Codeine    Lisinopril     cough   Niacin And Related    Wixela Inhub [Fluticasone-Salmeterol]     Gets jittery    ROS General: Denies fever, chills, night sweats, changes in weight, changes in appetite HEENT: Denies headaches, ear pain, changes in vision, rhinorrhea, sore throat   CV: Denies CP, palpitations, SOB, orthopnea Pulm: Denies SOB, cough, wheezing GI: Denies abdominal pain, nausea, vomiting, diarrhea, constipation GU: Denies dysuria, hematuria, frequency, vaginal discharge Msk: Denies muscle cramps, joint pains Neuro: Denies weakness, numbness, tingling Skin: Denies rashes, bruising Psych: Denies depression, anxiety, hallucinations     Objective:    Blood pressure (!) 154/88, pulse 74, temperature 98.3 F (36.8 C), temperature source Oral, weight 176 lb 6.4 oz (80 kg), SpO2 95 %.   Gen. Pleasant, well-nourished, in no distress, normal affect   HEENT: [place]/AT, face symmetric, conjunctiva clear, no scleral icterus, PERRLA, EOMI, nares patent without drainage, pharynx without erythema or exudate. Neck: No JVD, no thyromegaly, no carotid bruits Lungs: no accessory muscle use, CTAB, no wheezes or rales Cardiovascular: RRR, no m/r/g, no peripheral edema Musculoskeletal: No deformities, no cyanosis or clubbing,  normal tone Neuro:  A&Ox3, CN II-XII intact, normal gait Skin:  Warm, no lesions/ rash   Wt Readings from Last 3 Encounters:  09/08/21 176 lb 6.4 oz (80 kg)  07/24/21 177 lb 12.8 oz (80.6 kg)  06/17/20 178 lb (80.7 kg)    Lab Results  Component Value Date   WBC 7.4 07/24/2021   HGB 13.0 07/24/2021   HCT 39.6 07/24/2021   PLT 282.0 07/24/2021   GLUCOSE 94 07/24/2021   CHOL 168 07/24/2021   TRIG 155.0 (H) 07/24/2021   HDL 55.00 07/24/2021   LDLCALC 82 07/24/2021   ALT 25 07/24/2021   AST 26 07/24/2021   NA 141 07/24/2021   K 4.2 07/24/2021   CL 104 07/24/2021   CREATININE 0.84 07/24/2021   BUN 14 07/24/2021   CO2 27 07/24/2021   TSH 0.74 07/24/2021   HGBA1C 6.0 07/24/2021    Assessment/Plan:  Essential hypertension  -Elevated at this visit -Recheck 138/72 -Discussed increasing metoprolol tartrate from 12.5 mg twice daily to 25 mg twice daily -We will have patient hold Norvasc 5 mg daily.  Patient advised to keep medication in case additional BP control needed. -Encouraged her to keep checking BP daily and keep a log to bring with you to clinic.  Notify clinic for BP consistently >140/90. - Plan: metoprolol tartrate (LOPRESSOR) 25 MG tablet  Sinusitis, unspecified chronicity, unspecified location  -Improving -Continue supportive care including Flonase nasal spray as needed   F/u in 3-4 weeks  Abbe Amsterdam, MD

## 2021-09-08 NOTE — Patient Instructions (Signed)
A new prescription for metoprolol tartrate 25 mg twice a day was sent to your pharmacy.  You can start this increased dose today.  While we are increasing the dose of this medication hold the Norvasc 5 mg daily.  Do not throw away the Norvasc in case we need to restart it.  Continue checking her blood pressure and keeping a log daily.  We will have you follow-up in the next 3-4 weeks to see how things are going with your blood pressure.  If you notice a consistent elevation greater than 140/90 start feeling bad (dizziness, lightheadedness) because your blood pressure is lower notify the clinic.

## 2021-09-20 ENCOUNTER — Encounter: Payer: Self-pay | Admitting: Family Medicine

## 2021-10-12 ENCOUNTER — Other Ambulatory Visit: Payer: Medicare Other

## 2021-10-21 ENCOUNTER — Other Ambulatory Visit: Payer: Self-pay | Admitting: Family Medicine

## 2021-10-21 DIAGNOSIS — I10 Essential (primary) hypertension: Secondary | ICD-10-CM

## 2021-11-06 ENCOUNTER — Other Ambulatory Visit: Payer: Self-pay | Admitting: Family Medicine

## 2021-11-25 ENCOUNTER — Other Ambulatory Visit: Payer: Self-pay | Admitting: Family Medicine

## 2021-12-05 ENCOUNTER — Other Ambulatory Visit: Payer: Self-pay | Admitting: Family Medicine

## 2021-12-05 DIAGNOSIS — I10 Essential (primary) hypertension: Secondary | ICD-10-CM

## 2022-02-08 ENCOUNTER — Other Ambulatory Visit: Payer: Self-pay | Admitting: Family Medicine

## 2022-03-21 ENCOUNTER — Encounter: Payer: Self-pay | Admitting: Family Medicine

## 2022-03-21 ENCOUNTER — Ambulatory Visit (INDEPENDENT_AMBULATORY_CARE_PROVIDER_SITE_OTHER): Payer: Medicare Other | Admitting: Family Medicine

## 2022-03-21 VITALS — BP 132/88 | HR 78 | Temp 99.3°F | Wt 175.4 lb

## 2022-03-21 DIAGNOSIS — L299 Pruritus, unspecified: Secondary | ICD-10-CM | POA: Diagnosis not present

## 2022-03-21 DIAGNOSIS — L247 Irritant contact dermatitis due to plants, except food: Secondary | ICD-10-CM | POA: Diagnosis not present

## 2022-03-21 MED ORDER — HYDROCORTISONE 1 % EX OINT: 1.0000 "application " | TOPICAL_OINTMENT | Freq: Two times a day (BID) | CUTANEOUS | 0 refills | Status: DC

## 2022-03-21 MED ORDER — PREDNISONE 10 MG PO TABS: ORAL_TABLET | ORAL | 0 refills | Status: DC

## 2022-03-21 NOTE — Progress Notes (Signed)
Subjective:  ? ? Patient ID: Natalie Moran, female    DOB: [birth year], 71 y.o.   MRN: [id number] ? ?Chief Complaint  ?Patient presents with  ? Poison Ivy  ?  Poison oak was on rt arm, almost cleared up. Noticed yesterday her left eye red, swollen and itching.  Used poison oak rub on her arm, that helped. Did not use it on her eye, wanted to come in and see what she could do.   ? ? ?HPI ?Patient was seen today for acute concern.  Pt states she may have come in contact with poison oak over the wknd after cutting grass.  Pt states she noticed a pruritic, red, raised area on her R arm.  Pt then woke up with L eye lid edema and pruritis yesterday.  Was unsure if her cat came in contact with the poisonous plant  as he often sleeps in her bed or if she inadvertently got it on her eyelid while she was sleeping.  Patient denies of changes in vision, eye irritation, or drainage.  Endorses a shadow in vision due to eyelid edema.  Patient trying not to scratch.  Used OTC cream that helps some.  Patient states in the past prednisone causes increased hunger/weight gain, and insomnia. ? ?Past Medical History:  ?Diagnosis Date  ? Asthma   ? Hypercholesterolemia   ? Hypertension   ? ? ?Allergies  ?Allergen Reactions  ? Codeine   ? Lisinopril   ?  cough  ? Niacin And Related   ? Wixela Inhub [Fluticasone-Salmeterol]   ?  Gets jittery  ? ? ?ROS ?General: Denies fever, chills, night sweats, changes in weight, changes in appetite ?HEENT: Denies headaches, ear pain, changes in vision, rhinorrhea, sore throat ?CV: Denies CP, palpitations, SOB, orthopnea ?Pulm: Denies SOB, cough, wheezing ?GI: Denies abdominal pain, nausea, vomiting, diarrhea, constipation ?GU: Denies dysuria, hematuria, frequency, vaginal discharge ?Msk: Denies muscle cramps, joint pains ?Neuro: Denies weakness, numbness, tingling ?Skin: Denies rashes, bruising  +pruritic rash on R arm and L eyelid ?Psych: Denies depression, anxiety, hallucinations ? ?   ?Objective:  ?   ?Blood pressure 132/88, pulse 78, temperature 99.3 ?F (37.4 ?C), temperature source Oral, weight 175 lb 6.4 oz (79.6 kg), SpO2 96 %. ? ?Gen. Pleasant, well-nourished, in no distress, normal affect   ?HEENT: [place]/AT, left upper eyelid edematous hanging down into pt's vision, conjunctiva clear, no scleral icterus, PERRLA, EOMI, nares patent without drainage ?Lungs: no accessory muscle use, CTAB, no wheezes or rales ?Cardiovascular: RRR, no m/r/g, no peripheral edema ?Musculoskeletal: No deformities, no cyanosis or clubbing, normal tone ?Neuro:  A&Ox3, CN II-XII intact, normal gait ?Skin:  Warm, dry, intact.  Right AC fossa with erythematous plaque with small vesicles to scattered vesicles on right upper arm with erythematous base.  Left upper eyelid moderately edematous, red without rash or lesions. ? ? ?Wt Readings from Last 3 Encounters:  ?03/21/22 175 lb 6.4 oz (79.6 kg)  ?09/08/21 176 lb 6.4 oz (80 kg)  ?07/24/21 177 lb 12.8 oz (80.6 kg)  ? ? ?Lab Results  ?Component Value Date  ? WBC 7.4 07/24/2021  ? HGB 13.0 07/24/2021  ? HCT 39.6 07/24/2021  ? PLT 282.0 07/24/2021  ? GLUCOSE 94 07/24/2021  ? CHOL 168 07/24/2021  ? TRIG 155.0 (H) 07/24/2021  ? HDL 55.00 07/24/2021  ? LDLCALC 82 07/24/2021  ? ALT 25 07/24/2021  ? AST 26 07/24/2021  ? NA 141 07/24/2021  ? K 4.2 07/24/2021  ? CL  104 07/24/2021  ? CREATININE 0.84 07/24/2021  ? BUN 14 07/24/2021  ? CO2 27 07/24/2021  ? TSH 0.74 07/24/2021  ? HGBA1C 6.0 07/24/2021  ? ? ?Assessment/Plan: ? ?Irritant contact dermatitis due to plants, except food ?-Contact with Toxicodendron ?-Discussed expectant management of symptoms with OTC antihistamines such as Zyrtec, Allegra, Xyzal, or Benadryl-both may cause drowsiness, anti-itch/steroid cream. ?Discussed using cool compresses on left eyelid. ?-Discussed r/b/a of starting prednisone taper.  Pt ok with starting prednisone.  Will start low-dose taper x2 weeks.  Advised if needed can be stopped sooner however may have rebound  symptoms. ?-Patient also advised to clean cat's fur if they came in contact with poisonous plant. ?-Given handouts ?-Given precautions ? - Plan: predniSONE (DELTASONE) 10 MG tablet, hydrocortisone 1 % ointment ? ?Pruritus  ?-2/2 contact with Toxicodendron ?-OTC antihistamines and other expectant management ?-We will start lower dose prednisone taper 2/2 in the past prednisone causing increased hunger and insomnia for pt. ?- Plan: predniSONE (DELTASONE) 10 MG tablet, hydrocortisone 1 % ointment ? ?F/u as needed ? ?Abbe Amsterdam, MD ?

## 2022-07-19 ENCOUNTER — Other Ambulatory Visit: Payer: Self-pay | Admitting: Family Medicine

## 2022-07-19 DIAGNOSIS — Z1231 Encounter for screening mammogram for malignant neoplasm of breast: Secondary | ICD-10-CM

## 2022-07-25 ENCOUNTER — Ambulatory Visit (INDEPENDENT_AMBULATORY_CARE_PROVIDER_SITE_OTHER): Payer: Medicare Other | Admitting: Family Medicine

## 2022-07-25 VITALS — BP 130/78 | HR 76 | Temp 98.8°F | Ht 67.0 in | Wt 180.2 lb

## 2022-07-25 DIAGNOSIS — R7303 Prediabetes: Secondary | ICD-10-CM | POA: Diagnosis not present

## 2022-07-25 DIAGNOSIS — J3089 Other allergic rhinitis: Secondary | ICD-10-CM | POA: Diagnosis not present

## 2022-07-25 DIAGNOSIS — Z85820 Personal history of malignant melanoma of skin: Secondary | ICD-10-CM

## 2022-07-25 DIAGNOSIS — J4540 Moderate persistent asthma, uncomplicated: Secondary | ICD-10-CM

## 2022-07-25 DIAGNOSIS — E782 Mixed hyperlipidemia: Secondary | ICD-10-CM | POA: Diagnosis not present

## 2022-07-25 DIAGNOSIS — I10 Essential (primary) hypertension: Secondary | ICD-10-CM

## 2022-07-25 DIAGNOSIS — Z9889 Other specified postprocedural states: Secondary | ICD-10-CM

## 2022-07-25 DIAGNOSIS — Z1159 Encounter for screening for other viral diseases: Secondary | ICD-10-CM

## 2022-07-25 DIAGNOSIS — Z0000 Encounter for general adult medical examination without abnormal findings: Secondary | ICD-10-CM

## 2022-07-25 LAB — CBC WITH DIFFERENTIAL/PLATELET
Basophils Absolute: 0 10*3/uL (ref 0.0–0.1)
Basophils Relative: 0.6 % (ref 0.0–3.0)
Eosinophils Absolute: 0.1 10*3/uL (ref 0.0–0.7)
Eosinophils Relative: 1.6 % (ref 0.0–5.0)
HCT: 39.6 % (ref 36.0–46.0)
Hemoglobin: 13.1 g/dL (ref 12.0–15.0)
Lymphocytes Relative: 30.8 % (ref 12.0–46.0)
Lymphs Abs: 2.1 10*3/uL (ref 0.7–4.0)
MCHC: 33.1 g/dL (ref 30.0–36.0)
MCV: 92 fl (ref 78.0–100.0)
Monocytes Absolute: 0.7 10*3/uL (ref 0.1–1.0)
Monocytes Relative: 10.3 % (ref 3.0–12.0)
Neutro Abs: 3.9 10*3/uL (ref 1.4–7.7)
Neutrophils Relative %: 56.7 % (ref 43.0–77.0)
Platelets: 264 10*3/uL (ref 150.0–400.0)
RBC: 4.31 Mil/uL (ref 3.87–5.11)
RDW: 13.1 % (ref 11.5–15.5)
WBC: 6.8 10*3/uL (ref 4.0–10.5)

## 2022-07-25 LAB — LIPID PANEL
Cholesterol: 165 mg/dL (ref 0–200)
HDL: 53.2 mg/dL (ref >39.00)
LDL Cholesterol: 78 mg/dL (ref 0–99)
NonHDL: 111.56
Total CHOL/HDL Ratio: 3
Triglycerides: 170 mg/dL — ABNORMAL HIGH (ref 0.0–149.0)
VLDL: 34 mg/dL (ref 0.0–40.0)

## 2022-07-25 LAB — COMPREHENSIVE METABOLIC PANEL
ALT: 19 U/L (ref 0–35)
AST: 25 U/L (ref 0–37)
Albumin: 4.2 g/dL (ref 3.5–5.2)
Alkaline Phosphatase: 60 U/L (ref 39–117)
BUN: 17 mg/dL (ref 6–23)
CO2: 26 mEq/L (ref 19–32)
Calcium: 9.7 mg/dL (ref 8.4–10.5)
Chloride: 104 mEq/L (ref 96–112)
Creatinine, Ser: 0.88 mg/dL (ref 0.40–1.20)
GFR: 67.24 mL/min (ref >60.00)
Glucose, Bld: 91 mg/dL (ref 70–99)
Potassium: 4.4 mEq/L (ref 3.5–5.1)
Sodium: 139 mEq/L (ref 135–145)
Total Bilirubin: 1.6 mg/dL — ABNORMAL HIGH (ref 0.2–1.2)
Total Protein: 7.4 g/dL (ref 6.0–8.3)

## 2022-07-25 LAB — HEMOGLOBIN A1C: Hgb A1c MFr Bld: 6 % (ref 4.6–6.5)

## 2022-07-25 NOTE — Progress Notes (Signed)
Annual Wellness Visit    Patient: Natalie Moran, Female    DOB: 07/01/1951, 71 y.o.   MRN: [id number]  Subjective  Chief Complaint  Patient presents with   Annual Exam    Medicare well visit    Natalie Moran is a 71 y.o. female who presents today for her Annual Wellness Visit. She reports consuming a general diet.  Working in the yard/walking dog 2-3 times per day  She generally feels well. She reports sleeping well.   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  Previously seen by Dr. Nicholas Lose who is since retired.  Saw a new dermatologist. Has mammogram scheduled 08/08/22.  Hesitant to get done as caused bruising last 2 times.  Pt states it took 1.5 wks for bruising to resolve.  Inquires if due 2/2 taking aspirin 81 mg daily.  Holding aspirin until Had negative Cologuard 08/06/2021. Denies myalgias on Zocor 20 mg daily BP at home controlled 128-132/78-84.  Patient denies headaches, chest pain, changes in vision, LE edema. Endorses increased asthma/allergy symptoms this year.  Still receiving allergy shots.  Thinking about RSV vaccine. Denies issues with memory, hearing, vision.  Denies recent falls. Patient Active Problem List   Diagnosis Date Noted   Moderate persistent asthma without complication 12/09/2018   Essential hypertension 12/09/2018   Past Surgical History:  Procedure Laterality Date   APPENDECTOMY     BREAST EXCISIONAL BIOPSY Left    BREAST SURGERY     CESAREAN SECTION     EYE SURGERY     No family history on file. Allergies  Allergen Reactions   Codeine    Lisinopril     cough   Niacin And Related    Wixela Inhub [Fluticasone-Salmeterol]     Gets jittery      Medications: Outpatient Medications Prior to Visit  Medication Sig   albuterol (PROVENTIL HFA;VENTOLIN HFA) 108 (90 Base) MCG/ACT inhaler Inhale 1 puff into the lungs as needed for wheezing or shortness of breath.   CALCIUM PO Take by mouth.   EPINEPHrine 0.3 mg/0.3 mL IJ SOAJ  injection Inject 0.3 mg into the muscle as needed for anaphylaxis.   estradiol (ESTRACE) 0.1 MG/GM vaginal cream Place 1 Applicatorful vaginally every 7 (seven) days.   Fluticasone-Salmeterol (ADVAIR) 250-50 MCG/DOSE AEPB Inhale 1 puff into the lungs 2 (two) times daily.   MAGNESIUM PO Take by mouth.   metoprolol tartrate (LOPRESSOR) 25 MG tablet TAKE 1 TABLET BY MOUTH TWICE A DAY   simvastatin (ZOCOR) 20 MG tablet TAKE 1 TABLET BY MOUTH EVERYDAY AT BEDTIME   zafirlukast (ACCOLATE) 20 MG tablet Take 20 mg by mouth daily.   azithromycin (ZITHROMAX) 250 MG tablet Take 250 mg by mouth as directed.   hydrocortisone 1 % ointment Apply 1 application. topically 2 (two) times daily.   predniSONE (DELTASONE) 10 MG tablet Take 4 tabs (40 mg) in morning for 4 days, then take 3 tabs (30 mg) in am for 4 days, then take 2 tabs (20 mg) for 3 days, then 1 tab (10 mg) for 3 days.   No facility-administered medications prior to visit.    Allergies  Allergen Reactions   Codeine    Lisinopril     cough   Niacin And Related    Wixela Inhub [Fluticasone-Salmeterol]     Gets jittery    Patient Care Team: Deeann Saint, MD as PCP - General (Family Medicine)  ROS Review of systems negative unless otherwise stated above.  Objective  BP 130/78 (BP Location: Left Arm, Cuff Size: Normal)   Pulse 76   Temp 98.8 F (37.1 C) (Oral)   Ht 5\' 7"  (1.702 m)   Wt 180 lb 3.2 oz (81.7 kg)   SpO2 97%   BMI 28.22 kg/m    Physical Exam  Gen. Pleasant, well developed, well-nourished, in NAD HEENT - [place]/AT, PERRL, EOMI, conjunctive clear, no scleral icterus, no nasal drainage, pharynx without erythema or exudate.  TMs full bilaterally.  No cervical lymphadenopathy. Neck: No JVD, no thyromegaly, no carotid bruits Lungs: no use of accessory muscles, no dullness to percussion, CTAB, no wheezes, rales or rhonchi Cardiovascular: RRR, No r/g/m, no peripheral edema Abdomen: BS present, soft,  nontender,nondistended Musculoskeletal: No deformities, moves all four extremities, no cyanosis or clubbing, normal tone Neuro:  A&Ox3, CN II-XII intact, normal gait Skin:  Warm, dry, intact, no lesions.  Healing incision on dorsum right elbow.   Most recent functional status assessment:     No data to display         Most recent fall risk assessment:    07/22/2022    4:34 PM  Fall Risk   Falls in the past year? 0    Most recent depression screenings:    07/25/2022   10:11 AM 03/21/2022    1:09 PM  PHQ 2/9 Scores  PHQ - 2 Score 0 0  PHQ- 9 Score 2 0   Most recent cognitive screening:     No data to display         Most recent Audit-C alcohol use screening     No data to display         A score of 3 or more in women, and 4 or more in men indicates increased risk for alcohol abuse, EXCEPT if all of the points are from question 1   Vision/Hearing Screen: No results found. No results found for any visits on 07/25/22.    Assessment & Plan   Annual wellness visit done today including the all of the following: Reviewed patient's Family Medical History Reviewed and updated list of patient's medical providers Assessment of cognitive impairment was done Assessed patient's functional ability Established a written schedule for health screening services Health Risk Assessent Completed and Reviewed  Exercise Activities and Dietary recommendations  Goals   None     Immunization History  Administered Date(s) Administered   Fluad Quad(high Dose 65+) 07/26/2019, 07/24/2021   Influenza, High Dose Seasonal PF 08/23/2015, 08/28/2016, 08/20/2017, 08/03/2018, 09/08/2018, 09/07/2019, 09/06/2021   PFIZER(Purple Top)SARS-COV-2 Vaccination 12/01/2019, 12/21/2019, 09/10/2020, 09/06/2021, 11/22/2021   Pneumococcal Polysaccharide-23 11/02/2016, 06/17/2019, 09/07/2019, 09/06/2021   Tdap 06/17/2019   Zoster, Unspecified 11/23/2014    Health Maintenance  Topic Date Due    Zoster Vaccines- Shingrix (1 of 2) 07/19/2003   COVID-19 Vaccine (6 - Pfizer series) 01/17/2022   INFLUENZA VACCINE  06/12/2022   Pneumonia Vaccine 18+ Years old (2 - PCV) 09/06/2022   MAMMOGRAM  12/28/2022   TETANUS/TDAP  06/16/2029   COLONOSCOPY (Pts 45-50yrs Insurance coverage will need to be confirmed)  08/07/2031   DEXA SCAN  Completed   Hepatitis C Screening  Completed   HPV VACCINES  Aged Out     Discussed health benefits of physical activity, and encouraged her to engage in regular exercise appropriate for her age and condition.    Problem List Items Addressed This Visit       Cardiovascular and Mediastinum   Essential hypertension - Primary   Relevant  Orders   CMP     Respiratory   Moderate persistent asthma without complication   Relevant Orders   CBC with Differential/Platelet   Other Visit Diagnoses     Medicare annual wellness visit, subsequent       Mixed hyperlipidemia       Relevant Orders   Lipid panel   CMP   Prediabetes       Relevant Orders   Hemoglobin A1c   Environmental and seasonal allergies       Relevant Orders   CBC with Differential/Platelet   Encounter for hepatitis C screening test for low risk patient       Relevant Orders   Hep C Antibody   History of melanoma excision      Discussed precautions when outdoors including wearing sunscreen, sleep, protective clothing -Continue follow-up with dermatology     Return in about 5 months (around 12/25/2022), or if symptoms worsen or fail to improve.     Deeann Saint, MD

## 2022-07-26 LAB — HEPATITIS C ANTIBODY: Hepatitis C Ab: NONREACTIVE

## 2022-08-08 ENCOUNTER — Ambulatory Visit: Payer: Medicare Other

## 2022-08-10 ENCOUNTER — Other Ambulatory Visit: Payer: Self-pay | Admitting: Family Medicine

## 2022-08-17 ENCOUNTER — Other Ambulatory Visit: Payer: Self-pay | Admitting: Family Medicine

## 2022-08-17 DIAGNOSIS — I10 Essential (primary) hypertension: Secondary | ICD-10-CM

## 2022-08-30 ENCOUNTER — Ambulatory Visit: Payer: Medicare Other

## 2022-09-28 ENCOUNTER — Ambulatory Visit
Admission: RE | Admit: 2022-09-28 | Discharge: 2022-09-28 | Disposition: A | Discharge status: Home / Self Care | Payer: Medicare Other | Source: Ambulatory Visit | Attending: Family Medicine | Admitting: Family Medicine

## 2022-09-28 DIAGNOSIS — Z1231 Encounter for screening mammogram for malignant neoplasm of breast: Secondary | ICD-10-CM

## 2022-11-27 ENCOUNTER — Encounter: Payer: Self-pay | Admitting: Adult Health

## 2022-11-27 ENCOUNTER — Telehealth (INDEPENDENT_AMBULATORY_CARE_PROVIDER_SITE_OTHER): Payer: Medicare Other | Admitting: Adult Health

## 2022-11-27 VITALS — Temp 98.4°F | Ht 67.0 in | Wt 178.0 lb

## 2022-11-27 DIAGNOSIS — U071 COVID-19: Secondary | ICD-10-CM

## 2022-11-27 MED ORDER — NIRMATRELVIR/RITONAVIR (PAXLOVID)TABLET: 3.0000 | ORAL_TABLET | Freq: Two times a day (BID) | ORAL | 0 refills | Status: AC

## 2022-11-27 NOTE — Progress Notes (Signed)
Virtual Visit via Video Note  I connected with Natalie Moran on 11/27/22 at  3:15 PM EST by a video enabled telemedicine application and verified that I am speaking with the correct person using two identifiers.  Location patient: home Location provider:work or home office Persons participating in the virtual visit: patient, provider  I discussed the limitations of evaluation and management by telemedicine and the availability of in person appointments. The patient expressed understanding and agreed to proceed.   HPI:  72 year old female who  has a past medical history of Asthma, Hypercholesterolemia, and Hypertension.  She s being evaluated today for an acute issue. She tested positive for COVID 19 this morning when he symptoms started. Symptoms include low grade fever up to 100.6, sore throat, congestion, headache, fatigue, and body ache. Her husband was positive yesterday and started treatment.   ROS: See pertinent positives and negatives per HPI.  Past Medical History:  Diagnosis Date   Asthma    Hypercholesterolemia    Hypertension     Past Surgical History:  Procedure Laterality Date   APPENDECTOMY     BREAST EXCISIONAL BIOPSY Left    BREAST SURGERY     CESAREAN SECTION     EYE SURGERY      History reviewed. No pertinent family history.     Current Outpatient Medications:    albuterol (PROVENTIL HFA;VENTOLIN HFA) 108 (90 Base) MCG/ACT inhaler, Inhale 1 puff into the lungs as needed for wheezing or shortness of breath., Disp: , Rfl:    CALCIUM PO, Take by mouth., Disp: , Rfl:    EPINEPHrine 0.3 mg/0.3 mL IJ SOAJ injection, Inject 0.3 mg into the muscle as needed for anaphylaxis., Disp: , Rfl:    estradiol (ESTRACE) 0.1 MG/GM vaginal cream, Place 1 Applicatorful vaginally every 7 (seven) days., Disp: , Rfl:    Fluticasone-Salmeterol (ADVAIR) 250-50 MCG/DOSE AEPB, Inhale 1 puff into the lungs 2 (two) times daily., Disp: , Rfl:    MAGNESIUM PO, Take by mouth., Disp:  , Rfl:    metoprolol tartrate (LOPRESSOR) 25 MG tablet, TAKE 1 TABLET BY MOUTH TWICE A DAY, Disp: 180 tablet, Rfl: 1   simvastatin (ZOCOR) 20 MG tablet, TAKE 1 TABLET BY MOUTH EVERYDAY AT BEDTIME, Disp: 90 tablet, Rfl: 1   zafirlukast (ACCOLATE) 20 MG tablet, Take 20 mg by mouth daily., Disp: , Rfl:   EXAM:  VITALS per patient if applicable:  GENERAL: alert, oriented, appears well and in no acute distress  HEENT: atraumatic, conjunttiva clear, no obvious abnormalities on inspection of external nose and ears  NECK: normal movements of the head and neck  LUNGS: on inspection no signs of respiratory distress, breathing rate appears normal, no obvious gross SOB, gasping or wheezing  CV: no obvious cyanosis  MS: moves all visible extremities without noticeable abnormality  PSYCH/NEURO: pleasant and cooperative, no obvious depression or anxiety, speech and thought processing grossly intact  ASSESSMENT AND PLAN:  Discussed the following assessment and plan:  1. COVID-19 - Advised to stop statin while taking medication  - Discussed antiviral medication and patient wishes to start treatment  - nirmatrelvir/ritonavir (PAXLOVID) 20 x 150 MG & 10 x 100MG  TABS; Take 3 tablets by mouth 2 (two) times daily for 5 days. (Take nirmatrelvir 150 mg two tablets twice daily for 5 days and ritonavir 100 mg one tablet twice daily for 5 days) Patient GFR is <60  Dispense: 30 tablet; Refill: 0 - Follow [place] needed     I discussed the  assessment and treatment plan with the patient. The patient was provided an opportunity to ask questions and all were answered. The patient agreed with the plan and demonstrated an understanding of the instructions.   The patient was advised to call back or seek an in-person evaluation if the symptoms worsen or if the condition fails to improve as anticipated.   Dorothyann Peng, NP

## 2023-01-09 ENCOUNTER — Ambulatory Visit: Payer: Medicare Other | Admitting: Family Medicine

## 2023-02-12 ENCOUNTER — Other Ambulatory Visit: Payer: Self-pay | Admitting: Family Medicine

## 2023-02-12 DIAGNOSIS — I10 Essential (primary) hypertension: Secondary | ICD-10-CM

## 2023-02-27 ENCOUNTER — Encounter: Payer: Self-pay | Admitting: Family Medicine

## 2023-02-27 DIAGNOSIS — I10 Essential (primary) hypertension: Secondary | ICD-10-CM

## 2023-02-27 MED ORDER — METOPROLOL TARTRATE 25 MG PO TABS: 25.0000 mg | ORAL_TABLET | Freq: Two times a day (BID) | ORAL | 1 refills | Status: DC

## 2023-02-27 MED ORDER — SIMVASTATIN 20 MG PO TABS: ORAL_TABLET | ORAL | 1 refills | Status: DC

## 2023-05-21 ENCOUNTER — Other Ambulatory Visit: Payer: Self-pay | Admitting: Family Medicine

## 2023-06-27 ENCOUNTER — Encounter (INDEPENDENT_AMBULATORY_CARE_PROVIDER_SITE_OTHER): Payer: Self-pay

## 2023-07-29 ENCOUNTER — Encounter: Payer: Medicare Other | Admitting: Family Medicine

## 2023-08-06 ENCOUNTER — Encounter: Payer: Self-pay | Admitting: Family Medicine

## 2023-08-07 ENCOUNTER — Ambulatory Visit (INDEPENDENT_AMBULATORY_CARE_PROVIDER_SITE_OTHER): Payer: Medicare Other | Admitting: Family Medicine

## 2023-08-07 ENCOUNTER — Encounter: Payer: Self-pay | Admitting: Family Medicine

## 2023-08-07 VITALS — BP 134/76 | HR 78 | Temp 98.7°F | Ht 65.35 in | Wt 173.2 lb

## 2023-08-07 DIAGNOSIS — I10 Essential (primary) hypertension: Secondary | ICD-10-CM | POA: Diagnosis not present

## 2023-08-07 DIAGNOSIS — R7303 Prediabetes: Secondary | ICD-10-CM | POA: Diagnosis not present

## 2023-08-07 DIAGNOSIS — J310 Chronic rhinitis: Secondary | ICD-10-CM | POA: Diagnosis not present

## 2023-08-07 DIAGNOSIS — Z0000 Encounter for general adult medical examination without abnormal findings: Secondary | ICD-10-CM | POA: Diagnosis not present

## 2023-08-07 DIAGNOSIS — E782 Mixed hyperlipidemia: Secondary | ICD-10-CM | POA: Diagnosis not present

## 2023-08-07 DIAGNOSIS — Z780 Asymptomatic menopausal state: Secondary | ICD-10-CM

## 2023-08-07 DIAGNOSIS — Z23 Encounter for immunization: Secondary | ICD-10-CM

## 2023-08-07 NOTE — Patient Instructions (Addendum)
Atrovent (ipratropium) nasal spray 0.03% can help with your gustatory rhinitis symptoms (runny nose while eating).  Mention to allergist and sure it does not interfere with your allergy shots.  If you continue to have symptoms despite trying this medication we can place a referral to send you to ENT.  An order for a bone density scan was placed since the last time you had it done was in 2022.

## 2023-08-08 ENCOUNTER — Other Ambulatory Visit (INDEPENDENT_AMBULATORY_CARE_PROVIDER_SITE_OTHER): Payer: Medicare Other

## 2023-08-08 DIAGNOSIS — J310 Chronic rhinitis: Secondary | ICD-10-CM

## 2023-08-08 DIAGNOSIS — I10 Essential (primary) hypertension: Secondary | ICD-10-CM | POA: Diagnosis not present

## 2023-08-08 DIAGNOSIS — R7303 Prediabetes: Secondary | ICD-10-CM

## 2023-08-08 DIAGNOSIS — E782 Mixed hyperlipidemia: Secondary | ICD-10-CM

## 2023-08-08 LAB — CBC WITH DIFFERENTIAL/PLATELET
Basophils Absolute: 0 10*3/uL (ref 0.0–0.1)
Basophils Relative: 0.7 % (ref 0.0–3.0)
Eosinophils Absolute: 0.1 10*3/uL (ref 0.0–0.7)
Eosinophils Relative: 1.5 % (ref 0.0–5.0)
HCT: 40.4 % (ref 36.0–46.0)
Hemoglobin: 13.2 g/dL (ref 12.0–15.0)
Lymphocytes Relative: 38.3 % (ref 12.0–46.0)
Lymphs Abs: 2.4 10*3/uL (ref 0.7–4.0)
MCHC: 32.6 g/dL (ref 30.0–36.0)
MCV: 92.2 fl (ref 78.0–100.0)
Monocytes Absolute: 0.7 10*3/uL (ref 0.1–1.0)
Monocytes Relative: 11.9 % (ref 3.0–12.0)
Neutro Abs: 2.9 10*3/uL (ref 1.4–7.7)
Neutrophils Relative %: 47.6 % (ref 43.0–77.0)
Platelets: 284 10*3/uL (ref 150.0–400.0)
RBC: 4.38 Mil/uL (ref 3.87–5.11)
RDW: 13.1 % (ref 11.5–15.5)
WBC: 6.1 10*3/uL (ref 4.0–10.5)

## 2023-08-08 LAB — LIPID PANEL
Cholesterol: 158 mg/dL (ref 0–200)
HDL: 55 mg/dL (ref >39.00)
LDL Cholesterol: 72 mg/dL (ref 0–99)
NonHDL: 103.13
Total CHOL/HDL Ratio: 3
Triglycerides: 157 mg/dL — ABNORMAL HIGH (ref 0.0–149.0)
VLDL: 31.4 mg/dL (ref 0.0–40.0)

## 2023-08-08 LAB — COMPREHENSIVE METABOLIC PANEL
ALT: 16 U/L (ref 0–35)
AST: 21 U/L (ref 0–37)
Albumin: 4.3 g/dL (ref 3.5–5.2)
Alkaline Phosphatase: 64 U/L (ref 39–117)
BUN: 14 mg/dL (ref 6–23)
CO2: 28 mEq/L (ref 19–32)
Calcium: 9.4 mg/dL (ref 8.4–10.5)
Chloride: 105 mEq/L (ref 96–112)
Creatinine, Ser: 0.91 mg/dL (ref 0.40–1.20)
GFR: 64.12 mL/min (ref >60.00)
Glucose, Bld: 88 mg/dL (ref 70–99)
Potassium: 4.1 mEq/L (ref 3.5–5.1)
Sodium: 141 mEq/L (ref 135–145)
Total Bilirubin: 1.7 mg/dL — ABNORMAL HIGH (ref 0.2–1.2)
Total Protein: 7.3 g/dL (ref 6.0–8.3)

## 2023-08-08 LAB — TSH: TSH: 1.89 u[IU]/mL (ref 0.35–5.50)

## 2023-08-08 LAB — HEMOGLOBIN A1C: Hgb A1c MFr Bld: 5.7 % (ref 4.6–6.5)

## 2023-08-12 ENCOUNTER — Encounter: Payer: Self-pay | Admitting: Family Medicine

## 2023-08-14 MED ORDER — METOPROLOL TARTRATE 25 MG PO TABS
25.0000 mg | ORAL_TABLET | Freq: Two times a day (BID) | ORAL | 3 refills | Status: DC
Start: 2023-08-14 — End: 2024-09-25

## 2023-08-14 MED ORDER — IPRATROPIUM BROMIDE 0.03 % NA SOLN: 2.0000 | Freq: Two times a day (BID) | NASAL | 1 refills | Status: AC

## 2023-08-16 ENCOUNTER — Other Ambulatory Visit: Payer: Self-pay | Admitting: Family Medicine

## 2023-08-20 ENCOUNTER — Other Ambulatory Visit: Payer: Self-pay | Admitting: Family Medicine

## 2023-08-20 DIAGNOSIS — Z1231 Encounter for screening mammogram for malignant neoplasm of breast: Secondary | ICD-10-CM

## 2023-08-29 ENCOUNTER — Ambulatory Visit: Payer: Medicare Other | Admitting: Family Medicine

## 2023-08-29 DIAGNOSIS — Z0000 Encounter for general adult medical examination without abnormal findings: Secondary | ICD-10-CM

## 2023-08-29 NOTE — Patient Instructions (Signed)
I really enjoyed getting to talk with you today! I am available on Tuesdays and Thursdays for virtual visits if you have any questions or concerns, or if I can be of any further assistance.   CHECKLIST FROM ANNUAL WELLNESS VISIT:  -Follow up (please call to schedule if not scheduled after visit):   -yearly for annual wellness visit with primary care office  Here is a list of your preventive care/health maintenance measures and the plan for each if any are due:  PLAN For any measures below that may be due:   Health Maintenance  Topic Date Due   Zoster Vaccines- Shingrix (1 of 2) 07/19/2003   Pneumonia Vaccine 75+ Years old (2 of 2 - PCV) 09/06/2022   COVID-19 Vaccine (6 - 2023-24 season) 07/14/2023   Medicare Annual Wellness (AWV)  08/28/2024   MAMMOGRAM  09/28/2024   DTaP/Tdap/Td (3 - Td or Tdap) 06/16/2029   Colonoscopy  08/07/2031   INFLUENZA VACCINE  Completed   DEXA SCAN  Completed   Hepatitis C Screening  Completed   HPV VACCINES  Aged Out    -See a dentist at least yearly  -Get your eyes checked and then per your eye specialist's recommendations  -Other issues addressed today:   -I have included below further information regarding a healthy whole foods based diet, physical activity guidelines for adults, stress management and opportunities for social connections. I hope you find this information useful.   -----------------------------------------------------------------------------------------------------------------------------------------------------------------------------------------------------------------------------------------------------------  NUTRITION: -eat real food: lots of colorful vegetables (half the plate) and fruits -5-7 servings of vegetables and fruits per day (fresh or steamed is best), exp. 2 servings of vegetables with lunch and dinner and 2 servings of fruit per day. Berries and greens such as kale and collards are great choices.  -consume on a  regular basis: whole grains (make sure first ingredient on label contains the word "whole"), fresh fruits, fish, nuts, seeds, healthy oils (such as olive oil, avocado oil, grape seed oil) -may eat small amounts of dairy and lean meat on occasion, but avoid processed meats such as ham, bacon, lunch meat, etc. -drink water -try to avoid fast food and pre-packaged foods, processed meat -most experts advise limiting sodium to < 2300mg  per day, should limit further is any chronic conditions such as high blood pressure, heart disease, diabetes, etc. The American Heart Association advised that < 1500mg  is is ideal -try to avoid foods that contain any ingredients with names you do not recognize  -try to avoid sugar/sweets (except for the natural sugar that occurs in fresh fruit) -try to avoid sweet drinks -try to avoid white rice, white bread, pasta (unless whole grain), white or yellow potatoes  EXERCISE GUIDELINES FOR ADULTS: -if you wish to increase your physical activity, do so gradually and with the approval of your doctor -STOP and seek medical care immediately if you have any chest pain, chest discomfort or trouble breathing when starting or increasing exercise  -move and stretch your body, legs, feet and arms when sitting for long periods -Physical activity guidelines for optimal health in adults: -least 150 minutes per week of aerobic exercise (can talk, but not sing) once approved by your doctor, 20-30 minutes of sustained activity or two 10 minute episodes of sustained activity every day.  -resistance training at least 2 days per week if approved by your doctor -balance exercises 3+ days per week:   Stand somewhere where you have something sturdy to hold onto if you lose balance.    1)  lift up on toes, start with 5x per day and work up to 20x   2) stand and lift on leg straight out to the side so that foot is a few inches of the floor, start with 5x each side and work up to 20x each side   3)  stand on one foot, start with 5 seconds each side and work up to 20 seconds on each side  If you need ideas or help with getting more active:  -Silver sneakers https://tools.silversneakers.com  -Walk with a Doc: http://www.duncan-williams.com/  -try to include resistance (weight lifting/strength building) and balance exercises twice per week: or the following link for ideas: http://castillo-powell.com/  BuyDucts.dk  STRESS MANAGEMENT: -can try meditating, or just sitting quietly with deep breathing while intentionally relaxing all parts of your body for 5 minutes daily -if you need further help with stress, anxiety or depression please follow up with your primary doctor or contact the wonderful folks at [facility]: [phone]  SOCIAL CONNECTIONS: -options in [place] if you wish to engage in more social and exercise related activities:  -Silver sneakers https://tools.silversneakers.com  -Walk with a Doc: http://www.duncan-williams.com/  -Check out the [facility] Active Adults 50+ section on the [place] [employer] (hiking clubs, book clubs, cards and games, chess, exercise classes, aquatic classes and much more) - see the website for details: https://www.[place].gov/departments/parks-recreation/active-adults50  -YouTube has lots of exercise videos for different ages and abilities as well  -[facility] (a variety of indoor and outdoor inperson activities for adults). [phone]. [address]  -Virtual Online Classes (a variety of topics): see seniorplanet.org or call [phone]  -consider volunteering at a school, hospice center, church, senior center or elsewhere

## 2023-08-29 NOTE — Progress Notes (Signed)
PATIENT CHECK-IN and HEALTH RISK ASSESSMENT QUESTIONNAIRE:  -completed by phone/video for upcoming Medicare Preventive Visit  Pre-Visit Check-in: 1)Vitals (height, wt, BP, etc) - record in vitals section for visit on day of visit Request home vitals (wt, BP, etc.) and enter into vitals, THEN update Vital Signs SmartPhrase below at the top of the HPI. See below.  2)Review and Update Medications, Allergies PMH, Surgeries, Social history in Epic 3)Hospitalizations in the last year with date/reason?  no  4)Review and Update Care Team (patient's specialists) in Epic 5) Complete PHQ9 in Epic  6) Complete Fall Screening in Epic 7)Review all Health Maintenance Due and order under PCP if not done.  8)Medicare Wellness Questionnaire: Answer theses question about your habits: Do you drink alcohol?  no Have you ever smoked?no How many packs a day do/did you smoke? N/a Do you use smokeless tobacco? no Do you use an illicit drugs?no Do you exercises? Walks daily for 30-40 minutes, works out in the yard Typical breakfast: egg and bacon and cheese wrap, apple, total cereal, or oatmeal with berries Typical lunch: no lunch Typical dinner: sometimes cooks and sometime goes out Typical snacks:no a lot of snacks  Beverages: sweet tea  Answer theses question about you: Can you perform most household chores?yes Do you find it hard to follow a conversation in a noisy room? no Do you often ask people to speak up or repeat themselves?no Do you feel that you have a problem with memory?no Do you balance your checkbook and or bank acounts?yes Do you feel safe at home? yes Last dentist visit? yes Do you need assistance with any of the following: Please note if so  - none  Driving?  Feeding yourself?  Getting from bed to chair?  Getting to the toilet?  Bathing or showering?  Dressing yourself?  Managing money?  Climbing a flight of stairs  Preparing meals?  Do you have Advanced Directives in place  (Living Will, Healthcare Power or Attorney)?  yes   Last eye Exam and location? Goes to Dr. Ashley Royalty  office on a regular basis, last eye exam in april   Do you currently use prescribed or non-prescribed narcotic or opioid pain medications? no  Do you have a history or close family history of breast, ovarian, tubal or peritoneal cancer or a family member with BRCA (breast cancer susceptibility 1 and 2) gene mutations?not that she is aware of   Request home vitals (wt, BP, etc.) and enter into vitals, THEN update Vital Signs SmartPhrase below at the top of the HPI. See below.     ----------------------------------------------------------------------------------------------------------------------------------------------------------------------------------------------------------------------  Because this visit was a virtual/telehealth visit, some criteria may be missing or patient reported. Any vitals not documented were not able to be obtained and vitals that have been documented are patient reported.    MEDICARE ANNUAL PREVENTIVE VISIT WITH PROVIDER: (Welcome to Medicare, initial annual wellness or annual wellness exam)  Virtual Visit via Video Note  I connected with Maurine Cane on 08/29/23  a video enabled telemedicine application and verified that I am speaking with the correct person using two identifiers.  Location patient: home Location provider:work or home office Persons participating in the virtual visit: patient, provider  Concerns and/or follow up today: stable   See HM section in Epic for other details of completed HM.    ROS: negative for report of fevers, unintentional weight loss, vision changes, vision loss, hearing loss or change, chest pain, sob, hemoptysis, melena, hematochezia, hematuria, falls, bleeding or bruising,  thoughts of suicide or self harm, memory loss  Patient-completed extensive health risk assessment - reviewed and discussed with the  patient: See Health Risk Assessment completed with patient prior to the visit either above or in recent phone note. This was reviewed in detailed with the patient today and appropriate recommendations, orders and referrals were placed as needed per Summary below and patient instructions.   Review of Medical History: -PMH, PSH, Family History and current specialty and care providers reviewed and updated and listed below   Patient Care Team: Deeann Saint, MD as PCP - General (Family Medicine)   Past Medical History:  Diagnosis Date   Asthma    Hypercholesterolemia    Hypertension     Past Surgical History:  Procedure Laterality Date   APPENDECTOMY     BREAST EXCISIONAL BIOPSY Left    BREAST SURGERY     CESAREAN SECTION     EYE SURGERY      Social History   Socioeconomic History   Marital status: Married    Spouse name: Not on file   Number of children: Not on file   Years of education: Not on file   Highest education level: 12th grade  Occupational History   Not on file  Tobacco Use   Smoking status: Never    Passive exposure: Never   Smokeless tobacco: Never  Vaping Use   Vaping status: Never Used  Substance and Sexual Activity   Alcohol use: No   Drug use: Never   Sexual activity: Not on file  Other Topics Concern   Not on file  Social History Narrative   Not on file   Social Determinants of Health   Financial Resource Strain: Low Risk  (08/29/2023)   Overall Financial Resource Strain (CARDIA)    Difficulty of Paying Living Expenses: Not hard at all  Food Insecurity: No Food Insecurity (08/29/2023)   Hunger Vital Sign    Worried About Running Out of Food in the Last Year: Never true    Ran Out of Food in the Last Year: Never true  Transportation Needs: No Transportation Needs (08/29/2023)   PRAPARE - Administrator, Civil Service (Medical): No    Lack of Transportation (Non-Medical): No  Physical Activity: Insufficiently Active  (08/29/2023)   Exercise Vital Sign    Days of Exercise per Week: 3 days    Minutes of Exercise per Session: 30 min  Stress: No Stress Concern Present (08/29/2023)   [employer] of Occupational Health - Occupational Stress Questionnaire    Feeling of Stress : Only a little  Social Connections: Moderately Isolated (08/29/2023)   Social Connection and Isolation Panel [NHANES]    Frequency of Communication with Friends and Family: More than three times a week    Frequency of Social Gatherings with Friends and Family: Once a week    Attends Religious Services: Never    Database administrator or Organizations: No    Attends Engineer, structural: Not on file    Marital Status: Married  Catering manager Violence: Not At Risk (08/06/2023)   Humiliation, Afraid, Rape, and Kick questionnaire    Fear of Current or Ex-Partner: No    Emotionally Abused: No    Physically Abused: No    Sexually Abused: No    No family history on file.  Current Outpatient Medications on File Prior to Visit  Medication Sig Dispense Refill   albuterol (PROVENTIL HFA;VENTOLIN HFA) 108 (90 Base) MCG/ACT inhaler Inhale  1 puff into the lungs as needed for wheezing or shortness of breath.     aspirin EC 81 MG tablet Take by mouth.     CALCIUM PO Take by mouth.     EPINEPHrine 0.3 mg/0.3 mL IJ SOAJ injection Inject 0.3 mg into the muscle as needed for anaphylaxis.     estradiol (ESTRACE) 0.1 MG/GM vaginal cream Place 1 Applicatorful vaginally every 7 (seven) days.     Fluticasone-Salmeterol (ADVAIR) 250-50 MCG/DOSE AEPB Inhale 1 puff into the lungs 2 (two) times daily.     ipratropium (ATROVENT) 0.03 % nasal spray Place 2 sprays into both nostrils every 12 (twelve) hours. 30 mL 1   levalbuterol (XOPENEX HFA) 45 MCG/ACT inhaler 1-2 puffs     MAGNESIUM PO Take by mouth.     metoprolol tartrate (LOPRESSOR) 25 MG tablet Take 1 tablet (25 mg total) by mouth 2 (two) times daily. 180 tablet 3   simvastatin (ZOCOR)  40 MG tablet TAKE ONE TABLET BY MOUTH EVERY NIGHT AT BEDTIME 90 tablet 1   zafirlukast (ACCOLATE) 20 MG tablet Take 20 mg by mouth daily.     No current facility-administered medications on file prior to visit.    Allergies  Allergen Reactions   Codeine    Lisinopril Cough    cough   Niacin And Related    Wixela Inhub [Fluticasone-Salmeterol]     Gets jittery       Physical Exam Vitals requested from patient and listed below if patient had equipment and was able to obtain at home for this virtual visit: There were no vitals filed for this visit. Estimated body mass index is 28.51 kg/m as calculated from the following:   Height as of 08/07/23: 5' 5.35" (1.66 m).   Weight as of 08/07/23: 173 lb 3.2 oz (78.6 kg).  EKG (optional): deferred due to virtual visit  GENERAL: alert, oriented, no acute distress detected, full vision exam deferred due to pandemic and/or virtual encounter  HEENT: atraumatic, conjunttiva clear, no obvious abnormalities on inspection of external nose and ears  NECK: normal movements of the head and neck  LUNGS: on inspection no signs of respiratory distress, breathing rate appears normal, no obvious gross SOB, gasping or wheezing  CV: no obvious cyanosis  MS: moves all visible extremities without noticeable abnormality  PSYCH/NEURO: pleasant and cooperative, no obvious depression or anxiety, speech and thought processing grossly intact, Cognitive function grossly intact  Flowsheet Row Office Visit from 07/25/2022 in [facility]  PHQ-9 Total Score 2           08/29/2023   11:32 AM 08/06/2023    2:49 PM 07/25/2022   10:11 AM 03/21/2022    1:09 PM 07/24/2021   10:33 AM  Depression screen PHQ 2/9  Decreased Interest 0 0 0 0 1  Down, Depressed, Hopeless 0 0 0 0 0  PHQ - 2 Score 0 0 0 0 1  Altered sleeping   1 0 0  Tired, decreased energy   1 0 1  Change in appetite   0 0 0  Feeling bad or failure about yourself    0  0 0  Trouble concentrating   0 0 0  Moving slowly or fidgety/restless   0 0 0  Suicidal thoughts   0 0 0  PHQ-9 Score   2 0 2  Difficult doing work/chores   Not difficult at all Not difficult at all Not difficult at all  03/21/2022    1:09 PM 07/22/2022    4:34 PM 08/04/2023   10:38 PM 08/06/2023    2:48 PM 08/29/2023   11:31 AM  Fall Risk  Falls in the past year? 1 0 0 0 0  Was there an injury with Fall? 1   0 0  Fall Risk Category Calculator 2   0 0  Fall Risk Category (Retired) Moderate      (RETIRED) Patient Fall Risk Level Moderate fall risk      Patient at Risk for Falls Due to Other (Comment)   No Fall Risks History of fall(s)  Fall risk Follow up Falls evaluation completed   Falls evaluation completed      SUMMARY AND PLAN:  Initial Medicare annual wellness visit   Discussed applicable health maintenance/preventive health measures and advised and referred or ordered per patient preferences: -discussed vaccine due and recs per cdc, she will consider at pharmacy -bone density and mammogram scheduled Health Maintenance  Topic Date Due   Zoster Vaccines- Shingrix (1 of 2) 07/19/2003   Pneumonia Vaccine 2+ Years old (2 of 2 - PCV) 09/06/2022   COVID-19 Vaccine (6 - 2023-24 season) 07/14/2023   Medicare Annual Wellness (AWV)  08/28/2024   MAMMOGRAM  09/28/2024   DTaP/Tdap/Td (3 - Td or Tdap) 06/16/2029   Colonoscopy  08/07/2031   INFLUENZA VACCINE  Completed   DEXA SCAN  Completed   Hepatitis C Screening  Completed   HPV VACCINES  Aged [employer] and counseling on the following was provided based on the above review of health and a plan/checklist for the patient, along with additional information discussed, was provided for the patient in the patient instructions :  -Provided counseling and plan for increased risk of falling if applicable per above screening. Provided safe balance exercises that can be done at home to improve balance and discussed exercise  guidelines for adults with include balance exercises at least 3 days per week.  -Advised and counseled on a healthy lifestyle - including the importance of a healthy diet, regular physical activity -Reviewed patient's current diet. Advised and counseled on a whole foods based healthy diet. A summary of a healthy diet was provided in the Patient Instructions.  -reviewed patient's current physical activity level and discussed exercise guidelines for adults. Discussed community resources and ideas for safe exercise at home to assist in meeting exercise guideline recommendations in a safe and healthy way.  -Advise yearly dental visits at minimum and regular eye exams   Follow up: see patient instructions     Patient Instructions  I really enjoyed getting to talk with you today! I am available on Tuesdays and Thursdays for virtual visits if you have any questions or concerns, or if I can be of any further assistance.   CHECKLIST FROM ANNUAL WELLNESS VISIT:  -Follow up (please call to schedule if not scheduled after visit):   -yearly for annual wellness visit with primary care office  Here is a list of your preventive care/health maintenance measures and the plan for each if any are due:  PLAN For any measures below that may be due:   Health Maintenance  Topic Date Due   Zoster Vaccines- Shingrix (1 of 2) 07/19/2003   Pneumonia Vaccine 96+ Years old (2 of 2 - PCV) 09/06/2022   COVID-19 Vaccine (6 - 2023-24 season) 07/14/2023   Medicare Annual Wellness (AWV)  08/28/2024   MAMMOGRAM  09/28/2024   DTaP/Tdap/Td (3 - Td or  Tdap) 06/16/2029   Colonoscopy  08/07/2031   INFLUENZA VACCINE  Completed   DEXA SCAN  Completed   Hepatitis C Screening  Completed   HPV VACCINES  Aged Out    -See a dentist at least yearly  -Get your eyes checked and then per your eye specialist's recommendations  -Other issues addressed today:   -I have included below further information regarding a healthy  whole foods based diet, physical activity guidelines for adults, stress management and opportunities for social connections. I hope you find this information useful.   -----------------------------------------------------------------------------------------------------------------------------------------------------------------------------------------------------------------------------------------------------------  NUTRITION: -eat real food: lots of colorful vegetables (half the plate) and fruits -5-7 servings of vegetables and fruits per day (fresh or steamed is best), exp. 2 servings of vegetables with lunch and dinner and 2 servings of fruit per day. Berries and greens such as kale and collards are great choices.  -consume on a regular basis: whole grains (make sure first ingredient on label contains the word "whole"), fresh fruits, fish, nuts, seeds, healthy oils (such as olive oil, avocado oil, grape seed oil) -may eat small amounts of dairy and lean meat on occasion, but avoid processed meats such as ham, bacon, lunch meat, etc. -drink water -try to avoid fast food and pre-packaged foods, processed meat -most experts advise limiting sodium to < 2300mg  per day, should limit further is any chronic conditions such as high blood pressure, heart disease, diabetes, etc. The American Heart Association advised that < 1500mg  is is ideal -try to avoid foods that contain any ingredients with names you do not recognize  -try to avoid sugar/sweets (except for the natural sugar that occurs in fresh fruit) -try to avoid sweet drinks -try to avoid white rice, white bread, pasta (unless whole grain), white or yellow potatoes  EXERCISE GUIDELINES FOR ADULTS: -if you wish to increase your physical activity, do so gradually and with the approval of your doctor -STOP and seek medical care immediately if you have any chest pain, chest discomfort or trouble breathing when starting or increasing exercise  -move  and stretch your body, legs, feet and arms when sitting for long periods -Physical activity guidelines for optimal health in adults: -least 150 minutes per week of aerobic exercise (can talk, but not sing) once approved by your doctor, 20-30 minutes of sustained activity or two 10 minute episodes of sustained activity every day.  -resistance training at least 2 days per week if approved by your doctor -balance exercises 3+ days per week:   Stand somewhere where you have something sturdy to hold onto if you lose balance.    1) lift up on toes, start with 5x per day and work up to 20x   2) stand and lift on leg straight out to the side so that foot is a few inches of the floor, start with 5x each side and work up to 20x each side   3) stand on one foot, start with 5 seconds each side and work up to 20 seconds on each side  If you need ideas or help with getting more active:  -Silver sneakers https://tools.silversneakers.com  -Walk with a Doc: http://www.duncan-williams.com/  -try to include resistance (weight lifting/strength building) and balance exercises twice per week: or the following link for ideas: http://castillo-powell.com/  BuyDucts.dk  STRESS MANAGEMENT: -can try meditating, or just sitting quietly with deep breathing while intentionally relaxing all parts of your body for 5 minutes daily -if you need further help with stress, anxiety or depression please follow up  with your primary doctor or contact the wonderful folks at [facility]: [phone]  SOCIAL CONNECTIONS: -options in [place] if you wish to engage in more social and exercise related activities:  -Silver sneakers https://tools.silversneakers.com  -Walk with a Doc: http://www.duncan-williams.com/  -Check out the [facility] Active Adults 50+ section on the [employer] (hiking clubs, book clubs, cards and  games, chess, exercise classes, aquatic classes and much more) - see the website for details: https://www.[place].gov/departments/parks-recreation/active-adults50  -YouTube has lots of exercise videos for different ages and abilities as well  -[facility] (a variety of indoor and outdoor inperson activities for adults). 970-075-3869. [address]  -Virtual Online Classes (a variety of topics): see seniorplanet.org or call (787) 468-0426  -consider volunteering at a school, hospice center, church, senior center or elsewhere           Terressa Koyanagi, DO

## 2023-09-30 ENCOUNTER — Ambulatory Visit: Payer: Medicare Other

## 2023-11-08 ENCOUNTER — Ambulatory Visit: Payer: Medicare Other | Admitting: Family Medicine

## 2023-11-25 ENCOUNTER — Encounter: Payer: Self-pay | Admitting: Family Medicine

## 2023-11-25 ENCOUNTER — Ambulatory Visit (INDEPENDENT_AMBULATORY_CARE_PROVIDER_SITE_OTHER): Payer: Medicare Other | Admitting: Family Medicine

## 2023-11-25 VITALS — BP 142/74 | HR 81 | Temp 98.7°F | Ht 63.5 in | Wt 170.0 lb

## 2023-11-25 DIAGNOSIS — E782 Mixed hyperlipidemia: Secondary | ICD-10-CM

## 2023-11-25 DIAGNOSIS — R051 Acute cough: Secondary | ICD-10-CM | POA: Diagnosis not present

## 2023-11-25 DIAGNOSIS — J310 Chronic rhinitis: Secondary | ICD-10-CM | POA: Diagnosis not present

## 2023-11-25 DIAGNOSIS — I10 Essential (primary) hypertension: Secondary | ICD-10-CM | POA: Diagnosis not present

## 2023-11-25 NOTE — Patient Instructions (Signed)
 Start checking your blood pressure a few times per week at home.  If you are getting readings consistently greater than 140/90 (either number) notify clinic so we can increase your dose of metoprolol  tartrate 25 mg twice a day.  Continue to monitor your sodium intake and increase your physical activity.

## 2023-11-25 NOTE — Progress Notes (Signed)
 Established Patient Office Visit   Subjective  Patient ID: Natalie Moran, female    DOB: July 30, 1951  Age: 73 y.o. MRN: [id number]  Chief Complaint  Patient presents with   Medical Management of Chronic Issues    3 month follow-up     Patient is a 73 year old female seen for follow-up on HTN.  Patient endorses taking metoprolol  to tartrate 25 mg twice daily.  Has not been checking BP due to several in the family.  Patient also mentions increased stress with having to take a new insurance plan for this year.  Patient typically goes with the state plan as part of her retirement benefit however they did not choose an insurance plan until the last minute causing her to feel rushed.  Patient currently paying $800 a month for insurance for her and her husband.  Rhinorrhea while eating has resolved with use of ipratropium nasal spray.  Patient endorses mild productive cough that started 1 and half weeks ago.  Patient states symptoms are improving but cough lingers.  Denies SOB, wheezing, fever, chills, ST.    Patient Active Problem List   Diagnosis Date Noted   Moderate persistent asthma without complication 12/09/2018   Essential hypertension 12/09/2018   Past Medical History:  Diagnosis Date   Asthma    Hypercholesterolemia    Hypertension    Past Surgical History:  Procedure Laterality Date   APPENDECTOMY     BREAST EXCISIONAL BIOPSY Left    BREAST SURGERY     CESAREAN SECTION     EYE SURGERY     Social History   Tobacco Use   Smoking status: Never    Passive exposure: Never   Smokeless tobacco: Never  Vaping Use   Vaping status: Never Used  Substance Use Topics   Alcohol use: No   Drug use: Never   History reviewed. No pertinent family history. Allergies  Allergen Reactions   Codeine    Lisinopril Cough    cough   Niacin And Related    Wixela Inhub [Fluticasone-Salmeterol]     Gets jittery      ROS Negative unless stated above    Objective:      BP (!) 142/74 (BP Location: Right Arm, Patient Position: Sitting, Cuff Size: Small)   Pulse 81   Temp 98.7 F (37.1 C) (Oral)   Ht 5' 3.5 (1.613 m)   Wt 170 lb (77.1 kg)   SpO2 95%   BMI 29.64 kg/m  BP Readings from Last 3 Encounters:  11/25/23 (!) 142/74  08/07/23 134/76  07/25/22 130/78   Wt Readings from Last 3 Encounters:  11/25/23 170 lb (77.1 kg)  08/07/23 173 lb 3.2 oz (78.6 kg)  11/27/22 178 lb (80.7 kg)      Physical Exam Constitutional:      General: She is not in acute distress.    Appearance: Normal appearance.  HENT:     Head: Normocephalic and atraumatic.     Nose: Nose normal.     Mouth/Throat:     Mouth: Mucous membranes are moist.  Cardiovascular:     Rate and Rhythm: Normal rate and regular rhythm.     Heart sounds: Normal heart sounds. No murmur heard.    No gallop.  Pulmonary:     Effort: Pulmonary effort is normal. No respiratory distress.     Breath sounds: Normal breath sounds. No wheezing, rhonchi or rales.  Skin:    General: Skin is warm and dry.  Neurological:  Mental Status: She is alert and oriented to person, place, and time.       11/25/2023   11:43 AM 08/29/2023   11:32 AM 08/06/2023    2:49 PM  Depression screen PHQ 2/9  Decreased Interest 0 0 0  Down, Depressed, Hopeless 0 0 0  PHQ - 2 Score 0 0 0  Altered sleeping 0    Tired, decreased energy 1    Change in appetite 0    Feeling bad or failure about yourself  0    Trouble concentrating 0    Moving slowly or fidgety/restless 0    Suicidal thoughts 0    PHQ-9 Score 1    Difficult doing work/chores Not difficult at all         11/25/2023   11:43 AM 07/24/2021   10:33 AM  GAD 7 : Generalized Anxiety Score  Nervous, Anxious, on Edge 0 1  Control/stop worrying 0 1  Worry too much - different things 0 0  Trouble relaxing 0 0  Restless 0 0  Easily annoyed or irritable 0 0  Afraid - awful might happen 0 1  Total GAD 7 Score 0 3  Anxiety Difficulty Not difficult at  all Not difficult at all     No results found for any visits on 11/25/23.    Assessment & Plan:  Essential hypertension -elevated -Lifestyle modifications encouraged -For now continue metoprolol  tartrate 25 mg twice daily. -Will have patient monitor BP more frequently at home and bring log to next OFV. -For BP elevations consistently greater than 140/90 increase dose to 50 mg twice daily  Mixed hyperlipidemia -Continue lifestyle modifications -Continue Zocor  40 mg nightly  Gustatory rhinitis -Resolved -Continue ipratropium nasal spray  Acute cough -Likely postviral -Continue supportive care. -Discussed likely duration of symptoms -Notify clinic for worsening symptoms.  Return in about 3 months (around 02/23/2024) for blood pressure.   Clotilda JONELLE Single, MD

## 2023-11-26 ENCOUNTER — Ambulatory Visit
Admission: RE | Admit: 2023-11-26 | Discharge: 2023-11-26 | Disposition: A | Discharge status: Home / Self Care | Payer: Medicare Other | Source: Ambulatory Visit | Attending: Family Medicine | Admitting: Family Medicine

## 2023-11-26 DIAGNOSIS — Z1231 Encounter for screening mammogram for malignant neoplasm of breast: Secondary | ICD-10-CM

## 2024-02-01 LAB — EXTERNAL GENERIC LAB PROCEDURE: COLOGUARD: NEGATIVE

## 2024-02-01 LAB — LAB REPORT - SCANNED: Cologuard: NEGATIVE

## 2024-02-01 LAB — COLOGUARD: COLOGUARD: NEGATIVE

## 2024-02-05 ENCOUNTER — Encounter: Payer: Self-pay | Admitting: Family Medicine

## 2024-02-06 ENCOUNTER — Other Ambulatory Visit: Payer: Self-pay | Admitting: Family Medicine

## 2024-02-24 ENCOUNTER — Ambulatory Visit (INDEPENDENT_AMBULATORY_CARE_PROVIDER_SITE_OTHER): Payer: Medicare Other | Admitting: Family Medicine

## 2024-02-24 ENCOUNTER — Encounter: Payer: Self-pay | Admitting: Family Medicine

## 2024-02-24 VITALS — BP 148/79 | HR 81 | Temp 98.4°F | Ht 63.5 in | Wt 170.4 lb

## 2024-02-24 DIAGNOSIS — E782 Mixed hyperlipidemia: Secondary | ICD-10-CM | POA: Diagnosis not present

## 2024-02-24 DIAGNOSIS — I10 Essential (primary) hypertension: Secondary | ICD-10-CM | POA: Diagnosis not present

## 2024-02-24 NOTE — Progress Notes (Signed)
 Established Patient Office Visit   Subjective  Patient ID: Natalie Moran, female    DOB: Nov 25, 1950  Age: 73 y.o. MRN: [id number]  Chief Complaint  Patient presents with   Hypertension    3 month follow-up for Blood pressure     Pt is a  73 yo female seen for f/u on HTN.  BP at home 131/85, 136/80, 128/81, 146/91, 126/81, 137/85, 125/52, 129/79 with pulse 63-72.  Patient taking metoprolol 25 mg twice daily.  Increasing physical activity by doing yard work.  Patient notes increased stress should be improving as she was helping with her ex-husband's estate, who passed in October, as her son is in [place] .  Patient states his home just sold.  Patient denies being told she snores or waking up feeling unrested.    Patient Active Problem List   Diagnosis Date Noted   Moderate persistent asthma without complication 12/09/2018   Essential hypertension 12/09/2018   Past Medical History:  Diagnosis Date   Asthma    Hypercholesterolemia    Hypertension    Past Surgical History:  Procedure Laterality Date   APPENDECTOMY     BREAST EXCISIONAL BIOPSY Left    BREAST SURGERY     CESAREAN SECTION     EYE SURGERY     Social History   Tobacco Use   Smoking status: Never    Passive exposure: Never   Smokeless tobacco: Never  Vaping Use   Vaping status: Never Used  Substance Use Topics   Alcohol use: No   Drug use: Never   History reviewed. No pertinent family history. Allergies  Allergen Reactions   Codeine    Lisinopril Cough    cough   Niacin And Related    Wixela Inhub [Fluticasone-Salmeterol]     Gets jittery      ROS Negative unless stated above    Objective:     BP (!) 148/79 (BP Location: Right Arm, Patient Position: Sitting, Cuff Size: Normal)   Pulse 81   Temp 98.4 F (36.9 C) (Oral)   Ht 5' 3.5" (1.613 m)   Wt 170 lb 6.4 oz (77.3 kg)   SpO2 96%   BMI 29.71 kg/m  BP Readings from Last 3 Encounters:  02/24/24 (!) 148/79  11/25/23 (!) 142/74   08/07/23 134/76   Wt Readings from Last 3 Encounters:  02/24/24 170 lb 6.4 oz (77.3 kg)  11/25/23 170 lb (77.1 kg)  08/07/23 173 lb 3.2 oz (78.6 kg)    Physical Exam Constitutional:      General: She is not in acute distress.    Appearance: Normal appearance.  HENT:     Head: Normocephalic and atraumatic.     Nose: Nose normal.     Mouth/Throat:     Mouth: Mucous membranes are moist.  Cardiovascular:     Rate and Rhythm: Normal rate and regular rhythm.     Heart sounds: Normal heart sounds. No murmur heard.    No gallop.  Pulmonary:     Effort: Pulmonary effort is normal. No respiratory distress.     Breath sounds: Normal breath sounds. No wheezing, rhonchi or rales.  Skin:    General: Skin is warm and dry.  Neurological:     Mental Status: She is alert and oriented to person, place, and time.      No results found for any visits on 02/24/24.    Assessment & Plan:  Essential hypertension  Mixed hyperlipidemia  BP elevated initially in clinic.  Recheck improving but remains elevated.  Readings at home controlled.  Concern whitecoat hypertension causing elevation in clinic.  Patient advised to continue monitoring BP at home.  Continue metoprolol tartrate 25 mg twice daily.  For consistent elevations greater than 140/90 at home patient to notify clinic and we will increase metoprolol to 37.5 mg twice daily.  Continue lifestyle modifications.  Continue simvastatin 20 mg daily.  Return in about 5 months (around 08/07/2024) for physical, blood pressure.   Viola Greulich, MD

## 2024-03-17 ENCOUNTER — Ambulatory Visit
Admission: RE | Admit: 2024-03-17 | Discharge: 2024-03-17 | Disposition: A | Discharge status: Home / Self Care | Payer: Medicare Other | Source: Ambulatory Visit | Attending: Family Medicine

## 2024-03-17 DIAGNOSIS — Z780 Asymptomatic menopausal state: Secondary | ICD-10-CM

## 2024-03-19 ENCOUNTER — Encounter: Payer: Self-pay | Admitting: Family Medicine

## 2024-08-01 ENCOUNTER — Other Ambulatory Visit: Payer: Self-pay | Admitting: Family Medicine

## 2024-08-07 ENCOUNTER — Encounter: Payer: Self-pay | Admitting: Family Medicine

## 2024-08-07 ENCOUNTER — Ambulatory Visit: Payer: Medicare Other | Admitting: Family Medicine

## 2024-08-07 VITALS — BP 142/84 | HR 75 | Temp 98.5°F | Ht 63.5 in | Wt 175.2 lb

## 2024-08-07 DIAGNOSIS — Z23 Encounter for immunization: Secondary | ICD-10-CM | POA: Diagnosis not present

## 2024-08-07 DIAGNOSIS — J310 Chronic rhinitis: Secondary | ICD-10-CM | POA: Diagnosis not present

## 2024-08-07 DIAGNOSIS — M8580 Other specified disorders of bone density and structure, unspecified site: Secondary | ICD-10-CM | POA: Diagnosis not present

## 2024-08-07 DIAGNOSIS — Z0000 Encounter for general adult medical examination without abnormal findings: Secondary | ICD-10-CM | POA: Diagnosis not present

## 2024-08-07 DIAGNOSIS — R7303 Prediabetes: Secondary | ICD-10-CM

## 2024-08-07 DIAGNOSIS — I10 Essential (primary) hypertension: Secondary | ICD-10-CM | POA: Diagnosis not present

## 2024-08-07 LAB — LIPID PANEL
Cholesterol: 164 mg/dL (ref 0–200)
HDL: 54.2 mg/dL (ref >39.00)
LDL Cholesterol: 79 mg/dL (ref 0–99)
NonHDL: 110.09
Total CHOL/HDL Ratio: 3
Triglycerides: 153 mg/dL — ABNORMAL HIGH (ref 0.0–149.0)
VLDL: 30.6 mg/dL (ref 0.0–40.0)

## 2024-08-07 LAB — COMPREHENSIVE METABOLIC PANEL WITH GFR
ALT: 18 U/L (ref 0–35)
AST: 24 U/L (ref 0–37)
Albumin: 4.5 g/dL (ref 3.5–5.2)
Alkaline Phosphatase: 65 U/L (ref 39–117)
BUN: 15 mg/dL (ref 6–23)
CO2: 29 meq/L (ref 19–32)
Calcium: 9.8 mg/dL (ref 8.4–10.5)
Chloride: 105 meq/L (ref 96–112)
Creatinine, Ser: 0.96 mg/dL (ref 0.40–1.20)
GFR: 59.72 mL/min — ABNORMAL LOW (ref >60.00)
Glucose, Bld: 96 mg/dL (ref 70–99)
Potassium: 4.6 meq/L (ref 3.5–5.1)
Sodium: 140 meq/L (ref 135–145)
Total Bilirubin: 1.3 mg/dL — ABNORMAL HIGH (ref 0.2–1.2)
Total Protein: 7.5 g/dL (ref 6.0–8.3)

## 2024-08-07 LAB — TSH: TSH: 1.17 u[IU]/mL (ref 0.35–5.50)

## 2024-08-07 LAB — T4, FREE: Free T4: 0.89 ng/dL (ref 0.60–1.60)

## 2024-08-07 LAB — CBC WITH DIFFERENTIAL/PLATELET
Basophils Absolute: 0 K/uL (ref 0.0–0.1)
Basophils Relative: 0.6 % (ref 0.0–3.0)
Eosinophils Absolute: 0.1 K/uL (ref 0.0–0.7)
Eosinophils Relative: 1.3 % (ref 0.0–5.0)
HCT: 39.9 % (ref 36.0–46.0)
Hemoglobin: 13.2 g/dL (ref 12.0–15.0)
Lymphocytes Relative: 31.5 % (ref 12.0–46.0)
Lymphs Abs: 1.8 K/uL (ref 0.7–4.0)
MCHC: 33.1 g/dL (ref 30.0–36.0)
MCV: 91.7 fl (ref 78.0–100.0)
Monocytes Absolute: 0.7 K/uL (ref 0.1–1.0)
Monocytes Relative: 11.6 % (ref 3.0–12.0)
Neutro Abs: 3.1 K/uL (ref 1.4–7.7)
Neutrophils Relative %: 55 % (ref 43.0–77.0)
Platelets: 279 K/uL (ref 150.0–400.0)
RBC: 4.35 Mil/uL (ref 3.87–5.11)
RDW: 13.1 % (ref 11.5–15.5)
WBC: 5.7 K/uL (ref 4.0–10.5)

## 2024-08-07 LAB — VITAMIN D 25 HYDROXY (VIT D DEFICIENCY, FRACTURES): VITD: 38.22 ng/mL (ref 30.00–100.00)

## 2024-08-07 LAB — HEMOGLOBIN A1C: Hgb A1c MFr Bld: 6.2 % (ref 4.6–6.5)

## 2024-08-07 NOTE — Progress Notes (Signed)
 Established Patient Office Visit   Subjective  Patient ID: Natalie Moran, female    DOB: 1951/02/05  Age: 73 y.o. MRN: [id number]  Chief Complaint  Patient presents with   Annual Exam    Pt is a 73 yo female seen for CPE and f/u.  Pt doing well.  BP controlled at home.  Readings 128/69, 138/75, 130/79, 1 6/85, 132/62, 134/73.  Patient still trying to drink water.  Has cut down on the amount of sugar and her sweet tea.  Patient states she has been busy caring for friends who recently had health challenges.  Because of this when she gets home she is tired and has little time for herself.  Patient had bone density 03/17/2024 where osteopenia was noted.  She has since doubled up on her calcium supplement.  Patient followed by allergy for history of asthma and allergies.  Receiving allergy shots weekly.  Previously mention gustatory rhinitis.  States prescription for Flonase has been helpful.  Has mentioned this to her allergist.  Concerns allergy shots are causing symptoms.  If goes without allergy shot for 2-3 weeks symptoms resolved.  Does not feel like symptoms started until cockroach allergen was added to the mix of mold, dust allergy shot.  Patient has repeat allergy testing in mid 2026 scheduled.    Patient Active Problem List   Diagnosis Date Noted   Moderate persistent asthma without complication 12/09/2018   Essential hypertension 12/09/2018   Past Medical History:  Diagnosis Date   Allergy 30 yrs   dust & mole   Arthritis 10 yrs   Lower back; hands   Asthma    Cancer (HCC) 23 yrs   Skin Cancer   Cataract Surgery 12-04-17   GERD (gastroesophageal reflux disease) 25 yrs   Asthma medicine contributed   Hypercholesterolemia    Hypertension    Past Surgical History:  Procedure Laterality Date   APPENDECTOMY     BREAST EXCISIONAL BIOPSY Left    BREAST SURGERY     CESAREAN SECTION     EYE SURGERY     Social History   Tobacco Use   Smoking status: Never    Passive  exposure: Never   Smokeless tobacco: Never  Vaping Use   Vaping status: Never Used  Substance Use Topics   Alcohol use: Not Currently    Comment: Only an occasional drink, maybe 2 to 3 times a year.   Drug use: Never   Family History  Problem Relation Age of Onset   Arthritis Mother    Cancer Father    Heart disease Brother    Diabetes Brother    Cancer Sister    Cancer Brother    Diabetes Son    Allergies  Allergen Reactions   Codeine    Lisinopril Cough    cough   Niacin And Related    Wixela Inhub [Fluticasone-Salmeterol]     Gets jittery    ROS Negative unless stated above    Objective:     BP (!) 162/84 (BP Location: Left Arm, Patient Position: Sitting, Cuff Size: Normal)   Pulse 75   Temp 98.5 F (36.9 C) (Oral)   Ht 5' 3.5 (1.613 m)   Wt 175 lb 3.2 oz (79.5 kg)   SpO2 99%   BMI 30.55 kg/m  BP Readings from Last 3 Encounters:  08/07/24 (!) 162/84  02/24/24 (!) 148/79  11/25/23 (!) 142/74   Wt Readings from Last 3 Encounters:  08/07/24 175 lb 3.2  oz (79.5 kg)  02/24/24 170 lb 6.4 oz (77.3 kg)  11/25/23 170 lb (77.1 kg)      Physical Exam Constitutional:      Appearance: Normal appearance.  HENT:     Head: Normocephalic and atraumatic.     Right Ear: Tympanic membrane, ear canal and external ear normal.     Left Ear: Tympanic membrane, ear canal and external ear normal.     Nose: Nose normal.     Mouth/Throat:     Mouth: Mucous membranes are moist.     Pharynx: No oropharyngeal exudate or posterior oropharyngeal erythema.  Eyes:     General: No scleral icterus.    Extraocular Movements: Extraocular movements intact.     Conjunctiva/sclera: Conjunctivae normal.     Pupils: Pupils are equal, round, and reactive to light.  Neck:     Thyroid : No thyromegaly.     Vascular: No carotid bruit.  Cardiovascular:     Rate and Rhythm: Normal rate and regular rhythm.     Pulses: Normal pulses.     Heart sounds: Normal heart sounds. No murmur  heard.    No friction rub.  Pulmonary:     Effort: Pulmonary effort is normal.     Breath sounds: Normal breath sounds. No wheezing, rhonchi or rales.  Abdominal:     General: Bowel sounds are normal.     Palpations: Abdomen is soft.     Tenderness: There is no abdominal tenderness.  Musculoskeletal:        General: No deformity. Normal range of motion.  Lymphadenopathy:     Cervical: No cervical adenopathy.  Skin:    General: Skin is warm and dry.     Findings: No lesion.  Neurological:     General: No focal deficit present.     Mental Status: She is alert and oriented to person, place, and time.  Psychiatric:        Mood and Affect: Mood normal.        Thought Content: Thought content normal.        08/07/2024   10:18 AM 02/24/2024    1:39 PM 11/25/2023   11:43 AM  Depression screen PHQ 2/9  Decreased Interest 0 0 0  Down, Depressed, Hopeless 0 0 0  PHQ - 2 Score 0 0 0  Altered sleeping 1 1 0  Tired, decreased energy 1 1 1   Change in appetite 0 0 0  Feeling bad or failure about yourself  0 1 0  Trouble concentrating 0 0 0  Moving slowly or fidgety/restless 0 0 0  Suicidal thoughts 0 0 0  PHQ-9 Score 2 3 1   Difficult doing work/chores Not difficult at all Not difficult at all Not difficult at all      08/07/2024   10:18 AM 02/24/2024    1:39 PM 11/25/2023   11:43 AM 07/24/2021   10:33 AM  GAD 7 : Generalized Anxiety Score  Nervous, Anxious, on Edge 1 1 0 1  Control/stop worrying 0 1 0 1  Worry too much - different things 0 1 0 0  Trouble relaxing 0 0 0 0  Restless 0 1 0 0  Easily annoyed or irritable 0 1 0 0  Afraid - awful might happen 0 0 0 1  Total GAD 7 Score 1 5 0 3  Anxiety Difficulty Not difficult at all Not difficult at all Not difficult at all Not difficult at all     No results found for  any visits on 08/07/24.    Assessment & Plan:   Well adult exam -     CBC with Differential/Platelet; Future -     Comprehensive metabolic panel with GFR;  Future -     Hemoglobin A1c; Future -     Lipid panel; Future -     T4, free; Future -     TSH; Future  Essential hypertension -     Comprehensive metabolic panel with GFR; Future -     T4, free; Future -     TSH; Future  Osteopenia, unspecified location -     VITAMIN D  25 Hydroxy (Vit-D Deficiency, Fractures); Future  Prediabetes -     Hemoglobin A1c; Future  Need for pneumococcal 20-valent conjugate vaccination -     Pneumococcal conjugate vaccine 20-valent  Need for influenza vaccination -     Flu vaccine HIGH DOSE PF(Fluzone Trivalent)  Gustatory rhinitis   Age appropriate health screenings discussed.  Obtain labs.  Immunizations reviewed.  Flu and pneumonia vaccines given this visit.  BP controlled at home.  Component of whitecoat hypertension causing elevation in readings in office.  Recheck BP.  Continue Toprol  tartrate 25 mg twice daily.  Continue Flonase for gustatory rhinitis.  Concern cockroach component of allergy shot is causing symptoms as patient has relief when goes without allergy shot.  Advised to discuss further with allergist.  Continue current medications.  Continue calcium and vitamin D  supplement for history of osteopenia as noted on bone density from 03/17/2024.  Mammogram up-to-date as done 11/26/2023.  Colonoscopy up-to-date done 08/06/2021.  Return in about 4 months (around 12/07/2024) for blood pressure.   Clotilda JONELLE Single, MD

## 2024-08-13 ENCOUNTER — Ambulatory Visit: Payer: Self-pay | Admitting: Family Medicine

## 2024-09-24 ENCOUNTER — Other Ambulatory Visit: Payer: Self-pay | Admitting: Family Medicine

## 2024-09-24 DIAGNOSIS — I10 Essential (primary) hypertension: Secondary | ICD-10-CM

## 2024-09-28 ENCOUNTER — Ambulatory Visit

## 2024-09-28 VITALS — BP 122/62 | HR 63 | Temp 98.1°F | Ht 63.5 in | Wt 174.7 lb

## 2024-09-28 DIAGNOSIS — Z0000 Encounter for general adult medical examination without abnormal findings: Secondary | ICD-10-CM | POA: Diagnosis not present

## 2024-09-28 NOTE — Progress Notes (Signed)
 Chief Complaint  Patient presents with   Medicare Wellness     Subjective:   Natalie Moran is a 73 y.o. female who presents for a Medicare Annual Wellness Visit.  Allergies (verified) Codeine, Lisinopril, Niacin and related, and Wixela inhub [fluticasone-salmeterol]   History: Past Medical History:  Diagnosis Date   Allergy 30 yrs   dust & mole   Arthritis 10 yrs   Lower back; hands   Asthma    Cancer (HCC) 23 yrs   Skin Cancer   Cataract Surgery 12-04-17   GERD (gastroesophageal reflux disease) 25 yrs   Asthma medicine contributed   Hypercholesterolemia    Hypertension    Past Surgical History:  Procedure Laterality Date   APPENDECTOMY     BREAST EXCISIONAL BIOPSY Left    BREAST SURGERY     CESAREAN SECTION     EYE SURGERY     Family History  Problem Relation Age of Onset   Arthritis Mother    Cancer Father    Heart disease Brother    Diabetes Brother    Cancer Sister    Cancer Brother    Diabetes Son    Social History   Occupational History   Not on file  Tobacco Use   Smoking status: Never    Passive exposure: Never   Smokeless tobacco: Never  Vaping Use   Vaping status: Never Used  Substance and Sexual Activity   Alcohol use: Not Currently    Comment: Only an occasional drink, maybe 2 to 3 times a year.   Drug use: Never   Sexual activity: Not Currently    Birth control/protection: Post-menopausal    Comment: Husband has diabetes and has trouble with penis.   Tobacco Counseling Counseling given: No  SDOH Screenings   Food Insecurity: No Food Insecurity (09/28/2024)  Housing: Low Risk  (09/28/2024)  Transportation Needs: No Transportation Needs (09/28/2024)  Utilities: Not At Risk (09/28/2024)  Alcohol Screen: Low Risk  (09/27/2024)  Depression (PHQ2-9): Low Risk  (09/28/2024)  Financial Resource Strain: Low Risk  (09/27/2024)  Physical Activity: Insufficiently Active (09/28/2024)  Social Connections: Moderately Isolated  (09/28/2024)  Stress: No Stress Concern Present (09/28/2024)  Tobacco Use: Low Risk  (09/28/2024)  Health Literacy: Adequate Health Literacy (09/28/2024)   See flowsheets for full screening details  Depression Screen PHQ 2 & 9 Depression Scale- Over the past 2 weeks, how often have you been bothered by any of the following problems? Little interest or pleasure in doing things: 0 Feeling down, depressed, or hopeless (PHQ Adolescent also includes...irritable): 0 PHQ-2 Total Score: 0 Trouble falling or staying asleep, or sleeping too much: 0 Feeling tired or having little energy: 0 Poor appetite or overeating (PHQ Adolescent also includes...weight loss): 0 Feeling bad about yourself - or that you are a failure or have let yourself or your family down: 0 Trouble concentrating on things, such as reading the newspaper or watching television (PHQ Adolescent also includes...like school work): 0 Moving or speaking so slowly that other people could have noticed. Or the opposite - being so fidgety or restless that you have been moving around a lot more than usual: 0 Thoughts that you would be better off dead, or of hurting yourself in some way: 0 PHQ-9 Total Score: 0 If you checked off any problems, how difficult have these problems made it for you to do your work, take care of things at home, or get along with other people?: Not difficult at all  Goals Addressed               This Visit's Progress     Remain active (pt-stated)         Visit info / Clinical Intake: Medicare Wellness Visit Type:: Subsequent Annual Wellness Visit Persons participating in visit:: patient Medicare Wellness Visit Mode:: In-person (required for WTM) Information given by:: patient Interpreter Needed?: No Pre-visit prep was completed: yes AWV questionnaire completed by patient prior to visit?: yes Date:: 09/27/24 Living arrangements:: lives with spouse/significant other Patient's Overall Health Status  Rating: excellent Typical amount of pain: none Does pain affect daily life?: no Are you currently prescribed opioids?: no  Dietary Habits and Nutritional Risks How many meals a day?: 3 Eats fruit and vegetables daily?: yes Most meals are obtained by: preparing own meals In the last 2 weeks, have you had any of the following?: none Diabetic:: no  Functional Status Activities of Daily Living (to include ambulation/medication): Independent Ambulation: Independent with device- listed below Home Assistive Devices/Equipment: Eyeglasses Home Management: Independent Manage your own finances?: yes Primary transportation is: driving Concerns about vision?: (!) yes (Followed by medical attention) Concerns about hearing?: no  Fall Screening Falls in the past year?: 0 Number of falls in past year: 0 Was there an injury with Fall?: 0 Fall Risk Category Calculator: 0 Patient Fall Risk Level: Low Fall Risk  Fall Risk Patient at Risk for Falls Due to: No Fall Risks Fall risk Follow up: Falls evaluation completed  Home and Transportation Safety: All rugs have non-skid backing?: N/A, no rugs All stairs or steps have railings?: yes Grab bars in the bathtub or shower?: (!) no Have non-skid surface in bathtub or shower?: yes Good home lighting?: yes Regular seat belt use?: yes Hospital stays in the last year:: no  Cognitive Assessment Difficulty concentrating, remembering, or making decisions? : no Will 6CIT or Mini Cog be Completed: no 6CIT or Mini Cog Declined: patient alert, oriented, able to answer questions appropriately and recall recent events  Advance Directives (For Healthcare) Does Patient Have a Medical Advance Directive?: Yes Does patient want to make changes to medical advance directive?: No - Patient declined Type of Advance Directive: Healthcare Power of [place]; Living will Copy of Healthcare Power of Attorney in Chart?: No - copy requested Copy of Living Will in  Chart?: No - copy requested  Reviewed/Updated  Reviewed/Updated: Reviewed All (Medical, Surgical, Family, Medications, Allergies, Care Teams, Patient Goals)        Objective:    Today's Vitals   09/28/24 0909  BP: 122/62  Pulse: 63  Temp: 98.1 F (36.7 C)  TempSrc: Oral  SpO2: 99%  Weight: 174 lb 11.2 oz (79.2 kg)  Height: 5' 3.5 (1.613 m)   Body mass index is 30.46 kg/m.  Current Medications (verified) Outpatient Encounter Medications as of 09/28/2024  Medication Sig   aspirin EC 81 MG tablet Take by mouth.   CALCIUM PO Take by mouth.   EPINEPHrine 0.3 mg/0.3 mL IJ SOAJ injection Inject 0.3 mg into the muscle as needed for anaphylaxis.   estradiol (ESTRACE) 0.1 MG/GM vaginal cream Place 1 Applicatorful vaginally every 7 (seven) days.   Fluticasone-Salmeterol (ADVAIR) 250-50 MCG/DOSE AEPB Inhale 1 puff into the lungs 2 (two) times daily.   ipratropium (ATROVENT ) 0.03 % nasal spray Place 2 sprays into both nostrils every 12 (twelve) hours.   levalbuterol (XOPENEX HFA) 45 MCG/ACT inhaler 1-2 puffs   MAGNESIUM PO Take by mouth.   metoprolol  tartrate (LOPRESSOR ) 25 MG  tablet TAKE 1 TABLET (25 MG TOTAL) BY MOUTH 2 TIMES DAILY.   simvastatin  (ZOCOR ) 40 MG tablet TAKE ONE TABLET BY MOUTH EVERY NIGHT AT BEDTIME   zafirlukast (ACCOLATE) 20 MG tablet Take 20 mg by mouth daily.   No facility-administered encounter medications on file as of 09/28/2024.   Hearing/Vision screen Hearing Screening - Comments:: Denies hearing difficulties   Vision Screening - Comments:: Wears rx glasses - up to date with routine eye exams with  [facility] Immunizations and Health Maintenance Health Maintenance  Topic Date Due   COVID-19 Vaccine (7 - 2025-26 season) 07/13/2024   Zoster Vaccines- Shingrix (1 of 2) 11/06/2024 (Originally 07/19/2003)   Medicare Annual Wellness (AWV)  09/28/2025   Mammogram  11/25/2025   DTaP/Tdap/Td (3 - Td or Tdap) 06/16/2029   Colonoscopy  08/07/2031    Pneumococcal Vaccine: 50+ Years  Completed   Influenza Vaccine  Completed   DEXA SCAN  Completed   Hepatitis C Screening  Completed   Meningococcal B Vaccine  Aged Out        Assessment/Plan:  This is a routine wellness examination for Natalie Moran.  Patient Care Team: Natalie Clotilda SAUNDERS, MD as PCP - General (Family Medicine)  I have personally reviewed and noted the following in the patient's chart:   Medical and social history Use of alcohol, tobacco or illicit drugs  Current medications and supplements including opioid prescriptions. Functional ability and status Nutritional status Physical activity Advanced directives List of other physicians Hospitalizations, surgeries, and ER visits in previous 12 months Vitals Screenings to include cognitive, depression, and falls Referrals and appointments  No orders of the defined types were placed in this encounter.  In addition, I have reviewed and discussed with patient certain preventive protocols, quality metrics, and best practice recommendations. A written personalized care plan for preventive services as well as general preventive health recommendations were provided to patient.   Natalie LELON Blush, LPN   [id number]/81/[id number]   Return in 1 year 10/04/25  After Visit Summary: (In Person-Printed) AVS printed and given to the patient  Nurse Notes: None

## 2024-09-28 NOTE — Patient Instructions (Addendum)
 Ms. Nantz,  Thank you for taking the time for your Medicare Wellness Visit. I appreciate your continued commitment to your health goals. Please review the care plan we discussed, and feel free to reach out if I can assist you further.  Please note that Annual Wellness Visits do not include a physical exam. Some assessments may be limited, especially if the visit was conducted virtually. If needed, we may recommend an in-person follow-up with your provider.  Ongoing Care Seeing your primary care provider every 3 to 6 months helps us  monitor your health and provide consistent, personalized care.   Referrals If a referral was made during today's visit and you haven't received any updates within two weeks, please contact the referred provider directly to check on the status.  Recommended Screenings:  Health Maintenance  Topic Date Due   COVID-19 Vaccine (7 - 2025-26 season) 07/13/2024   Zoster (Shingles) Vaccine (1 of 2) 11/06/2024*   Medicare Annual Wellness Visit  09/28/2025   Breast Cancer Screening  11/25/2025   DTaP/Tdap/Td vaccine (3 - Td or Tdap) 06/16/2029   Colon Cancer Screening  08/07/2031   Pneumococcal Vaccine for age over 27  Completed   Flu Shot  Completed   DEXA scan (bone density measurement)  Completed   Hepatitis C Screening  Completed   Meningitis B Vaccine  Aged Out  *Topic was postponed. The date shown is not the original due date.       09/28/2024    9:18 AM  Advanced Directives  Does Patient Have a Medical Advance Directive? Yes  Type of Estate Agent of [place];Living will  Does patient want to make changes to medical advance directive? No - Patient declined  Copy of Healthcare Power of Attorney in Chart? No - copy requested    Vision: Annual vision screenings are recommended for early detection of glaucoma, cataracts, and diabetic retinopathy. These exams can also reveal signs of chronic conditions such as diabetes and high blood  pressure.  Dental: Annual dental screenings help detect early signs of oral cancer, gum disease, and other conditions linked to overall health, including heart disease and diabetes.  Please see the attached documents for additional preventive care recommendations.

## 2024-12-07 ENCOUNTER — Ambulatory Visit: Admitting: Family Medicine

## 2024-12-09 ENCOUNTER — Ambulatory Visit: Admitting: Family Medicine

## 2024-12-17 ENCOUNTER — Ambulatory Visit: Admitting: Family Medicine

## 2024-12-30 ENCOUNTER — Ambulatory Visit: Admitting: Family Medicine

## 2025-10-04 ENCOUNTER — Ambulatory Visit
